# Patient Record
Sex: Male | Born: 1935 | Race: White | Hispanic: No | Marital: Married | Smoking: Former smoker
Health system: Southern US, Community
[De-identification: ages and names within clinical notes are randomized; demographics above are authoritative.]

## PROBLEM LIST (undated history)

## (undated) DIAGNOSIS — K7030 Alcoholic cirrhosis of liver without ascites: Secondary | ICD-10-CM

## (undated) DIAGNOSIS — K767 Hepatorenal syndrome: Secondary | ICD-10-CM

## (undated) DIAGNOSIS — R7989 Other specified abnormal findings of blood chemistry: Secondary | ICD-10-CM

## (undated) DIAGNOSIS — D696 Thrombocytopenia, unspecified: Secondary | ICD-10-CM

## (undated) DIAGNOSIS — I10 Essential (primary) hypertension: Secondary | ICD-10-CM

## (undated) DIAGNOSIS — E119 Type 2 diabetes mellitus without complications: Secondary | ICD-10-CM

## (undated) DIAGNOSIS — D509 Iron deficiency anemia, unspecified: Secondary | ICD-10-CM

## (undated) HISTORY — PX: FRACTURE SURGERY: SHX138

## (undated) HISTORY — DX: Iron deficiency anemia, unspecified: D50.9

## (undated) HISTORY — PX: TONSILLECTOMY: SUR1361

---

## 2013-04-13 ENCOUNTER — Encounter: Payer: Self-pay | Admitting: Internal Medicine

## 2013-04-15 ENCOUNTER — Encounter: Payer: Self-pay | Admitting: Internal Medicine

## 2013-04-27 LAB — BASIC METABOLIC PANEL
Anion Gap: 7 (ref 7–16)
Calcium, Total: 8.1 mg/dL — ABNORMAL LOW (ref 8.5–10.1)
Chloride: 102 mmol/L (ref 98–107)
Co2: 26 mmol/L (ref 21–32)
Creatinine: 2.82 mg/dL — ABNORMAL HIGH (ref 0.60–1.30)
EGFR (African American): 24 — ABNORMAL LOW
EGFR (Non-African Amer.): 21 — ABNORMAL LOW
Glucose: 142 mg/dL — ABNORMAL HIGH (ref 65–99)
Potassium: 4.1 mmol/L (ref 3.5–5.1)
Sodium: 135 mmol/L — ABNORMAL LOW (ref 136–145)

## 2013-04-27 LAB — CBC WITH DIFFERENTIAL/PLATELET
HCT: 24 % — ABNORMAL LOW (ref 40.0–52.0)
Lymphocyte %: 25.4 %
MCHC: 34.6 g/dL (ref 32.0–36.0)
Monocyte #: 0.3 x10 3/mm (ref 0.2–1.0)
Neutrophil #: 1.4 10*3/uL (ref 1.4–6.5)
Neutrophil %: 58.7 %
Platelet: 71 10*3/uL — ABNORMAL LOW (ref 150–440)
RBC: 2.45 10*6/uL — ABNORMAL LOW (ref 4.40–5.90)
RDW: 17.4 % — ABNORMAL HIGH (ref 11.5–14.5)

## 2013-05-06 ENCOUNTER — Observation Stay: Payer: Self-pay | Admitting: Internal Medicine

## 2013-05-06 LAB — CBC WITH DIFFERENTIAL/PLATELET
Basophil #: 0 10*3/uL (ref 0.0–0.1)
Lymphocyte %: 37.9 %
MCHC: 34.4 g/dL (ref 32.0–36.0)
Monocyte #: 0.3 x10 3/mm (ref 0.2–1.0)
Monocyte %: 14 %
Platelet: 76 10*3/uL — ABNORMAL LOW (ref 150–440)
RDW: 16.6 % — ABNORMAL HIGH (ref 11.5–14.5)
WBC: 2.4 10*3/uL — ABNORMAL LOW (ref 3.8–10.6)

## 2013-05-06 LAB — PROTIME-INR: Prothrombin Time: 16.2 secs — ABNORMAL HIGH (ref 11.5–14.7)

## 2013-05-07 LAB — COMPREHENSIVE METABOLIC PANEL
Albumin: 1.7 g/dL — ABNORMAL LOW (ref 3.4–5.0)
Alkaline Phosphatase: 65 U/L (ref 50–136)
Anion Gap: 5 — ABNORMAL LOW (ref 7–16)
Bilirubin,Total: 0.6 mg/dL (ref 0.2–1.0)
Calcium, Total: 8.2 mg/dL — ABNORMAL LOW (ref 8.5–10.1)
Co2: 27 mmol/L (ref 21–32)
Creatinine: 2.9 mg/dL — ABNORMAL HIGH (ref 0.60–1.30)
EGFR (African American): 23 — ABNORMAL LOW
EGFR (Non-African Amer.): 20 — ABNORMAL LOW
Glucose: 83 mg/dL (ref 65–99)
SGPT (ALT): 28 U/L (ref 12–78)
Sodium: 135 mmol/L — ABNORMAL LOW (ref 136–145)
Total Protein: 4.5 g/dL — ABNORMAL LOW (ref 6.4–8.2)

## 2013-05-12 LAB — CBC WITH DIFFERENTIAL/PLATELET
Basophil %: 0.6 %
HGB: 9.4 g/dL — ABNORMAL LOW (ref 13.0–18.0)
Lymphocyte %: 27.6 %
MCH: 33.1 pg (ref 26.0–34.0)
MCHC: 34.1 g/dL (ref 32.0–36.0)
Monocyte #: 0.4 x10 3/mm (ref 0.2–1.0)
Neutrophil #: 2.2 10*3/uL (ref 1.4–6.5)
RDW: 17.5 % — ABNORMAL HIGH (ref 11.5–14.5)

## 2013-05-14 LAB — OCCULT BLOOD X 1 CARD TO LAB, STOOL: Occult Blood, Feces: NEGATIVE

## 2013-05-16 ENCOUNTER — Encounter: Payer: Self-pay | Admitting: Internal Medicine

## 2013-06-08 LAB — COMPREHENSIVE METABOLIC PANEL
Albumin: 1.9 g/dL — ABNORMAL LOW (ref 3.4–5.0)
Alkaline Phosphatase: 76 U/L (ref 50–136)
Chloride: 106 mmol/L (ref 98–107)
Co2: 23 mmol/L (ref 21–32)
EGFR (African American): 23 — ABNORMAL LOW
EGFR (Non-African Amer.): 20 — ABNORMAL LOW
Glucose: 122 mg/dL — ABNORMAL HIGH (ref 65–99)
Potassium: 3.8 mmol/L (ref 3.5–5.1)
SGOT(AST): 22 U/L (ref 15–37)
SGPT (ALT): 15 U/L (ref 12–78)
Sodium: 138 mmol/L (ref 136–145)
Total Protein: 5.2 g/dL — ABNORMAL LOW (ref 6.4–8.2)

## 2013-06-08 LAB — CBC WITH DIFFERENTIAL/PLATELET
Eosinophil #: 0 10*3/uL (ref 0.0–0.7)
Eosinophil %: 1.1 %
HCT: 25 % — ABNORMAL LOW (ref 40.0–52.0)
HGB: 8.8 g/dL — ABNORMAL LOW (ref 13.0–18.0)
Lymphocyte #: 1 10*3/uL (ref 1.0–3.6)
Lymphocyte %: 22.6 %
Monocyte #: 0.3 x10 3/mm (ref 0.2–1.0)
Neutrophil #: 3.2 10*3/uL (ref 1.4–6.5)
Neutrophil %: 69 %
RBC: 2.5 10*6/uL — ABNORMAL LOW (ref 4.40–5.90)
WBC: 4.6 10*3/uL (ref 3.8–10.6)

## 2013-06-15 ENCOUNTER — Encounter: Payer: Self-pay | Admitting: Internal Medicine

## 2013-07-16 ENCOUNTER — Encounter: Payer: Self-pay | Admitting: Internal Medicine

## 2013-08-02 ENCOUNTER — Ambulatory Visit: Payer: Self-pay

## 2013-09-13 ENCOUNTER — Ambulatory Visit: Payer: Self-pay | Admitting: Gastroenterology

## 2013-09-15 ENCOUNTER — Ambulatory Visit: Payer: Self-pay | Admitting: Gastroenterology

## 2013-10-15 ENCOUNTER — Ambulatory Visit: Payer: Self-pay | Admitting: Internal Medicine

## 2013-10-15 LAB — CBC CANCER CENTER
Basophil #: 0 x10 3/mm (ref 0.0–0.1)
Basophil %: 0.5 %
Eosinophil #: 0 x10 3/mm (ref 0.0–0.7)
Lymphocyte #: 1.3 x10 3/mm (ref 1.0–3.6)
MCHC: 34.3 g/dL (ref 32.0–36.0)
Monocyte #: 0.5 x10 3/mm (ref 0.2–1.0)
Monocyte %: 7.4 %
Neutrophil #: 4.2 x10 3/mm (ref 1.4–6.5)
RBC: 2.69 10*6/uL — ABNORMAL LOW (ref 4.40–5.90)
RDW: 12.5 % (ref 11.5–14.5)

## 2013-10-16 ENCOUNTER — Ambulatory Visit: Payer: Self-pay | Admitting: Internal Medicine

## 2013-11-22 ENCOUNTER — Ambulatory Visit: Payer: Self-pay | Admitting: Internal Medicine

## 2013-11-22 LAB — CBC CANCER CENTER
Basophil #: 0 x10 3/mm (ref 0.0–0.1)
Basophil %: 0.7 %
Eosinophil #: 0 x10 3/mm (ref 0.0–0.7)
Eosinophil %: 0.9 %
HCT: 26.5 % — ABNORMAL LOW (ref 40.0–52.0)
Lymphocyte #: 1.2 x10 3/mm (ref 1.0–3.6)
Lymphocyte %: 28.3 %
MCH: 35.3 pg — ABNORMAL HIGH (ref 26.0–34.0)
MCHC: 33.3 g/dL (ref 32.0–36.0)
MCV: 106 fL — ABNORMAL HIGH (ref 80–100)
Neutrophil %: 62.8 %
Platelet: 97 x10 3/mm — ABNORMAL LOW (ref 150–440)
RDW: 14.6 % — ABNORMAL HIGH (ref 11.5–14.5)
WBC: 4.1 x10 3/mm (ref 3.8–10.6)

## 2013-11-23 LAB — KAPPA/LAMBDA FREE LIGHT CHAINS (ARMC)

## 2013-12-16 ENCOUNTER — Ambulatory Visit: Payer: Self-pay | Admitting: Internal Medicine

## 2013-12-21 LAB — BASIC METABOLIC PANEL
Anion Gap: 13 (ref 7–16)
BUN: 62 mg/dL — ABNORMAL HIGH (ref 7–18)
CHLORIDE: 100 mmol/L (ref 98–107)
CO2: 26 mmol/L (ref 21–32)
Calcium, Total: 9.6 mg/dL (ref 8.5–10.1)
Creatinine: 4.59 mg/dL — ABNORMAL HIGH (ref 0.60–1.30)
EGFR (African American): 13 — ABNORMAL LOW
EGFR (Non-African Amer.): 12 — ABNORMAL LOW
Glucose: 131 mg/dL — ABNORMAL HIGH (ref 65–99)
OSMOLALITY: 297 (ref 275–301)
Potassium: 3.9 mmol/L (ref 3.5–5.1)
Sodium: 139 mmol/L (ref 136–145)

## 2013-12-21 LAB — CBC CANCER CENTER
BASOS ABS: 0 x10 3/mm (ref 0.0–0.1)
Basophil %: 0.6 %
EOS ABS: 0.1 x10 3/mm (ref 0.0–0.7)
EOS PCT: 1.3 %
HCT: 27.9 % — ABNORMAL LOW (ref 40.0–52.0)
HGB: 9.3 g/dL — ABNORMAL LOW (ref 13.0–18.0)
LYMPHS PCT: 27.3 %
Lymphocyte #: 1.2 x10 3/mm (ref 1.0–3.6)
MCH: 35.3 pg — AB (ref 26.0–34.0)
MCHC: 33.3 g/dL (ref 32.0–36.0)
MCV: 106 fL — ABNORMAL HIGH (ref 80–100)
Monocyte #: 0.3 x10 3/mm (ref 0.2–1.0)
Monocyte %: 7.5 %
NEUTROS ABS: 2.7 x10 3/mm (ref 1.4–6.5)
Neutrophil %: 63.3 %
Platelet: 112 x10 3/mm — ABNORMAL LOW (ref 150–440)
RBC: 2.63 10*6/uL — AB (ref 4.40–5.90)
RDW: 13.5 % (ref 11.5–14.5)
WBC: 4.3 x10 3/mm (ref 3.8–10.6)

## 2013-12-23 LAB — AFP TUMOR MARKER: AFP-Tumor Marker: 0.8 ng/mL (ref 0.0–8.3)

## 2014-01-16 ENCOUNTER — Ambulatory Visit: Payer: Self-pay | Admitting: Internal Medicine

## 2014-01-17 LAB — CBC CANCER CENTER
BASOS ABS: 0 x10 3/mm (ref 0.0–0.1)
BASOS PCT: 0.9 %
EOS ABS: 0 x10 3/mm (ref 0.0–0.7)
Eosinophil %: 1 %
HCT: 26.8 % — ABNORMAL LOW (ref 40.0–52.0)
HGB: 9 g/dL — ABNORMAL LOW (ref 13.0–18.0)
LYMPHS ABS: 1.1 x10 3/mm (ref 1.0–3.6)
Lymphocyte %: 27.8 %
MCH: 34.9 pg — ABNORMAL HIGH (ref 26.0–34.0)
MCHC: 33.5 g/dL (ref 32.0–36.0)
MCV: 104 fL — ABNORMAL HIGH (ref 80–100)
MONOS PCT: 9.9 %
Monocyte #: 0.4 x10 3/mm (ref 0.2–1.0)
NEUTROS PCT: 60.4 %
Neutrophil #: 2.4 x10 3/mm (ref 1.4–6.5)
Platelet: 101 x10 3/mm — ABNORMAL LOW (ref 150–440)
RBC: 2.57 10*6/uL — AB (ref 4.40–5.90)
RDW: 12.9 % (ref 11.5–14.5)
WBC: 3.9 x10 3/mm (ref 3.8–10.6)

## 2014-02-13 ENCOUNTER — Ambulatory Visit: Payer: Self-pay | Admitting: Internal Medicine

## 2014-05-02 ENCOUNTER — Ambulatory Visit: Payer: Self-pay | Admitting: Internal Medicine

## 2014-05-16 ENCOUNTER — Ambulatory Visit: Payer: Self-pay | Admitting: Internal Medicine

## 2015-01-25 DIAGNOSIS — F1010 Alcohol abuse, uncomplicated: Secondary | ICD-10-CM | POA: Insufficient documentation

## 2015-01-25 DIAGNOSIS — N184 Chronic kidney disease, stage 4 (severe): Secondary | ICD-10-CM | POA: Insufficient documentation

## 2015-04-25 ENCOUNTER — Ambulatory Visit
Admission: RE | Admit: 2015-04-25 | Discharge: 2015-04-25 | Disposition: A | Discharge status: Home / Self Care | Payer: Medicare Other | Source: Ambulatory Visit | Attending: Nephrology | Admitting: Nephrology

## 2015-04-25 DIAGNOSIS — D631 Anemia in chronic kidney disease: Secondary | ICD-10-CM | POA: Diagnosis present

## 2015-04-25 DIAGNOSIS — N189 Chronic kidney disease, unspecified: Secondary | ICD-10-CM | POA: Insufficient documentation

## 2015-04-25 HISTORY — DX: Thrombocytopenia, unspecified: D69.6

## 2015-04-25 HISTORY — DX: Hepatorenal syndrome: K76.7

## 2015-04-25 HISTORY — DX: Other specified abnormal findings of blood chemistry: R79.89

## 2015-04-25 HISTORY — DX: Alcoholic cirrhosis of liver without ascites: K70.30

## 2015-04-25 LAB — ABO/RH: ABO/RH(D): O NEG

## 2015-04-25 LAB — HEMOGLOBIN: Hemoglobin: 7.2 g/dL — ABNORMAL LOW (ref 13.0–18.0)

## 2015-04-25 LAB — PREPARE RBC (CROSSMATCH)

## 2015-04-25 NOTE — Progress Notes (Signed)
Pt blood transfusion completed without reaction. Pt declines po fluids or snacks. Pt waiting transportation home.

## 2015-04-26 LAB — TYPE AND SCREEN
ABO/RH(D): O NEG
Antibody Screen: NEGATIVE
UNIT DIVISION: 0

## 2015-04-28 ENCOUNTER — Other Ambulatory Visit: Payer: Self-pay | Admitting: *Deleted

## 2015-05-01 ENCOUNTER — Inpatient Hospital Stay (HOSPITAL_BASED_OUTPATIENT_CLINIC_OR_DEPARTMENT_OTHER): Payer: Medicare Other | Admitting: Internal Medicine

## 2015-05-01 ENCOUNTER — Other Ambulatory Visit: Payer: Self-pay | Admitting: Internal Medicine

## 2015-05-01 ENCOUNTER — Inpatient Hospital Stay: Payer: Medicare Other | Attending: Internal Medicine

## 2015-05-01 ENCOUNTER — Inpatient Hospital Stay: Payer: Medicare Other

## 2015-05-01 VITALS — BP 131/81 | HR 101 | Temp 98.0°F | Resp 18 | Wt 175.0 lb

## 2015-05-01 DIAGNOSIS — F1010 Alcohol abuse, uncomplicated: Secondary | ICD-10-CM | POA: Insufficient documentation

## 2015-05-01 DIAGNOSIS — N184 Chronic kidney disease, stage 4 (severe): Secondary | ICD-10-CM

## 2015-05-01 DIAGNOSIS — K7030 Alcoholic cirrhosis of liver without ascites: Secondary | ICD-10-CM | POA: Insufficient documentation

## 2015-05-01 DIAGNOSIS — K767 Hepatorenal syndrome: Secondary | ICD-10-CM | POA: Diagnosis not present

## 2015-05-01 DIAGNOSIS — D619 Aplastic anemia, unspecified: Secondary | ICD-10-CM | POA: Insufficient documentation

## 2015-05-01 DIAGNOSIS — Z79899 Other long term (current) drug therapy: Secondary | ICD-10-CM

## 2015-05-01 DIAGNOSIS — D631 Anemia in chronic kidney disease: Secondary | ICD-10-CM

## 2015-05-01 DIAGNOSIS — I129 Hypertensive chronic kidney disease with stage 1 through stage 4 chronic kidney disease, or unspecified chronic kidney disease: Secondary | ICD-10-CM | POA: Diagnosis not present

## 2015-05-01 DIAGNOSIS — R531 Weakness: Secondary | ICD-10-CM

## 2015-05-01 DIAGNOSIS — N189 Chronic kidney disease, unspecified: Secondary | ICD-10-CM

## 2015-05-01 DIAGNOSIS — E119 Type 2 diabetes mellitus without complications: Secondary | ICD-10-CM | POA: Insufficient documentation

## 2015-05-01 DIAGNOSIS — D649 Anemia, unspecified: Secondary | ICD-10-CM

## 2015-05-01 LAB — HEMOGLOBIN: HEMOGLOBIN: 10.2 g/dL — AB (ref 13.0–18.0)

## 2015-05-09 ENCOUNTER — Encounter: Payer: Self-pay | Admitting: Internal Medicine

## 2015-05-09 NOTE — Progress Notes (Signed)
[facility] Progress note   Referred by dr Wynelle Link   This 79 y.o. male patient presents to the clinic for evaluation of anemia   Chief Complaint/Problem List: has Chronic kidney disease (CKD), stage IV (severe); Diabetes mellitus, type 2; AA (alcohol abuse); Bone marrow failure; and ALC (alcoholic liver cirrhosis) on his problem list.    HPI:  SEE ALSO 05/02/14 NOTE, REFERRED [facility] FOR PROGRESSIVE ANEMIA, TO CONSIDER PROCRIT, PRIOR KNOWN TO ME FROM 05/02/14, THEN RELEASED  hgb recently noted lower than prior, with cre stable, chronic low plts no hx bleeding   On 04/25/15 hgb 7.2 and one unit prbc given  No acute complaints no cp no sob no dizziness no palpitation    Review of Systems:  He has some general weakness leg weakness no sob at rest no abdo pain no cp no edema                       Allergies No Known Allergies  Significant History/PMH: Past Medical History  Diagnosis Date  . Alcoholic cirrhosis   . Thrombocytopenia   . High serum ferritin   . Hepatorenal syndrome     Secondary   History reviewed. No pertinent past surgical history.          Smoking History prior smoker quit 1967  PFSH: Family History:  Family History  Problem Relation Age of Onset  . Cancer - Colon Mother 71  . Breast cancer Sister 19  . Prostate cancer Other     Comments:   Social History:  History  Alcohol Use  . Yes    Comment: 5 drinks per day/ discussed alcohol health risks    Additional Past Medical and Surgical History:  barretts   Also hx neghiv and rpr in 04/2013   Home Medications: Prior to Admission medications   Medication Sig Start Date End Date Taking? Authorizing Provider  Multiple Vitamin (MULTIVITAMIN) tablet Take 1 tablet by mouth daily.   Yes Historical Provider, MD  furosemide (LASIX) 20 MG tablet Take 20 mg by mouth.    Historical Provider, MD  loperamide (IMODIUM) 2 MG capsule Take by mouth as needed for diarrhea or loose stools.    Historical  Provider, MD  METOPROLOL SUCCINATE ER PO Take 25 mg by mouth.    Historical Provider, MD  mirtazapine (REMERON) 7.5 MG tablet Take 7.5 mg by mouth at bedtime.    Historical Provider, MD  omeprazole (PRILOSEC) 40 MG capsule Take 40 mg by mouth daily.    Historical Provider, MD    Vital Signs:  Blood pressure 131/81, pulse 101, temperature 98 F (36.7 C), temperature source Oral, resp. rate 18, weight 175 lb (79.379 kg), SpO2 97 %.  Physical Exam:  General:  no acute distress  Mental Status: alert and oriented   Head, Ears, Nose,Throat: No thrush  Respiratory: no rales, rhonchi, or wheezing, no dullness  Cardiovascular: regular rate and rhythm    Musculoskeletal: no lower extremity edema no calf tenderness    Neurological: No gross focal weakness cranial nerves intact  Lymphatics: Not palpable, neck supraclavicular, submandibular, axilla       Laboratory Results: Appointment on 05/01/2015  Component Date Value Ref Range Status  . Hemoglobin 05/01/2015 10.2* 13.0 - 18.0 g/dL Final          Radiology Results: No results found.         Assessment and Plan: Impression:  Anemia, recently progressive, no hx of bleeding, hgb up  to 10.2 aftre transfusion..most likely cause progressive cirrhosis and direct effect of alcohol, currently admits to 2 drinks per day, likely more   Plan: w/u anemia with iron b12, electropheresis, also abdo u/s and afp level. Procrit prn

## 2015-05-11 ENCOUNTER — Ambulatory Visit: Payer: Self-pay

## 2015-05-11 ENCOUNTER — Ambulatory Visit: Payer: Self-pay | Admitting: Internal Medicine

## 2015-05-11 ENCOUNTER — Other Ambulatory Visit: Payer: Self-pay

## 2015-05-18 ENCOUNTER — Other Ambulatory Visit: Payer: Self-pay | Admitting: *Deleted

## 2015-05-18 ENCOUNTER — Telehealth: Payer: Self-pay | Admitting: *Deleted

## 2015-05-18 NOTE — Telephone Encounter (Signed)
Prescription for CBC with diff/plts, Iron/TIBC, Ferritin, and B-12 level to be drawn on Monday 05/22/15 and faxed to Rhett BannisterLeslie Herring, AGNP-C faxed to Attn: Azalia BilisLori Wade [facility] [place] at [phone].Marland Kitchen..Marland Kitchen

## 2015-05-22 ENCOUNTER — Encounter: Payer: Self-pay | Admitting: Family Medicine

## 2015-05-24 ENCOUNTER — Encounter: Payer: Self-pay | Admitting: Family Medicine

## 2015-05-24 ENCOUNTER — Inpatient Hospital Stay: Payer: Medicare Other | Attending: Internal Medicine

## 2015-05-24 ENCOUNTER — Inpatient Hospital Stay: Payer: Medicare Other | Admitting: Family Medicine

## 2015-05-24 ENCOUNTER — Other Ambulatory Visit: Payer: Self-pay | Admitting: Family Medicine

## 2015-05-24 ENCOUNTER — Inpatient Hospital Stay: Payer: Medicare Other

## 2015-05-24 DIAGNOSIS — D509 Iron deficiency anemia, unspecified: Secondary | ICD-10-CM

## 2015-05-24 HISTORY — DX: Iron deficiency anemia, unspecified: D50.9

## 2015-06-22 ENCOUNTER — Inpatient Hospital Stay: Payer: Medicare Other | Attending: Family Medicine

## 2015-06-22 ENCOUNTER — Ambulatory Visit: Payer: Self-pay | Admitting: Family Medicine

## 2015-06-22 ENCOUNTER — Other Ambulatory Visit: Payer: Self-pay

## 2015-06-22 ENCOUNTER — Inpatient Hospital Stay (HOSPITAL_BASED_OUTPATIENT_CLINIC_OR_DEPARTMENT_OTHER): Payer: Medicare Other | Admitting: Family Medicine

## 2015-06-22 VITALS — BP 142/74 | HR 67 | Temp 97.7°F | Resp 16

## 2015-06-22 DIAGNOSIS — R5381 Other malaise: Secondary | ICD-10-CM

## 2015-06-22 DIAGNOSIS — N184 Chronic kidney disease, stage 4 (severe): Secondary | ICD-10-CM

## 2015-06-22 DIAGNOSIS — Z800 Family history of malignant neoplasm of digestive organs: Secondary | ICD-10-CM | POA: Diagnosis not present

## 2015-06-22 DIAGNOSIS — D631 Anemia in chronic kidney disease: Secondary | ICD-10-CM

## 2015-06-22 DIAGNOSIS — R531 Weakness: Secondary | ICD-10-CM | POA: Insufficient documentation

## 2015-06-22 DIAGNOSIS — Z79899 Other long term (current) drug therapy: Secondary | ICD-10-CM

## 2015-06-22 DIAGNOSIS — R5383 Other fatigue: Secondary | ICD-10-CM | POA: Diagnosis not present

## 2015-06-22 DIAGNOSIS — E119 Type 2 diabetes mellitus without complications: Secondary | ICD-10-CM | POA: Diagnosis not present

## 2015-06-22 DIAGNOSIS — R600 Localized edema: Secondary | ICD-10-CM

## 2015-06-22 DIAGNOSIS — D509 Iron deficiency anemia, unspecified: Secondary | ICD-10-CM | POA: Insufficient documentation

## 2015-06-22 DIAGNOSIS — Z8042 Family history of malignant neoplasm of prostate: Secondary | ICD-10-CM | POA: Insufficient documentation

## 2015-06-22 DIAGNOSIS — I129 Hypertensive chronic kidney disease with stage 1 through stage 4 chronic kidney disease, or unspecified chronic kidney disease: Secondary | ICD-10-CM

## 2015-06-22 DIAGNOSIS — K7030 Alcoholic cirrhosis of liver without ascites: Secondary | ICD-10-CM | POA: Insufficient documentation

## 2015-06-22 DIAGNOSIS — D696 Thrombocytopenia, unspecified: Secondary | ICD-10-CM

## 2015-06-22 DIAGNOSIS — Z803 Family history of malignant neoplasm of breast: Secondary | ICD-10-CM | POA: Diagnosis not present

## 2015-06-22 DIAGNOSIS — Z87891 Personal history of nicotine dependence: Secondary | ICD-10-CM | POA: Diagnosis not present

## 2015-06-22 LAB — CBC WITH DIFFERENTIAL/PLATELET
Basophils Absolute: 0 10*3/uL (ref 0–0.1)
Basophils Relative: 1 %
Eosinophils Absolute: 0 10*3/uL (ref 0–0.7)
Eosinophils Relative: 1 %
HEMATOCRIT: 28.2 % — AB (ref 40.0–52.0)
Hemoglobin: 9.3 g/dL — ABNORMAL LOW (ref 13.0–18.0)
LYMPHS ABS: 1 10*3/uL (ref 1.0–3.6)
LYMPHS PCT: 20 %
MCH: 34.7 pg — ABNORMAL HIGH (ref 26.0–34.0)
MCHC: 32.9 g/dL (ref 32.0–36.0)
MCV: 105.6 fL — ABNORMAL HIGH (ref 80.0–100.0)
MONO ABS: 0.4 10*3/uL (ref 0.2–1.0)
Monocytes Relative: 7 %
Neutro Abs: 3.6 10*3/uL (ref 1.4–6.5)
Neutrophils Relative %: 71 %
Platelets: 109 10*3/uL — ABNORMAL LOW (ref 150–440)
RBC: 2.67 MIL/uL — ABNORMAL LOW (ref 4.40–5.90)
RDW: 15.2 % — ABNORMAL HIGH (ref 11.5–14.5)
WBC: 5 10*3/uL (ref 3.8–10.6)

## 2015-06-22 LAB — IRON AND TIBC
IRON: 101 ug/dL (ref 45–182)
SATURATION RATIOS: 69 % — AB (ref 17.9–39.5)
TIBC: 146 ug/dL — ABNORMAL LOW (ref 250–450)
UIBC: 45 ug/dL

## 2015-06-22 LAB — FERRITIN: Ferritin: 1287 ng/mL — ABNORMAL HIGH (ref 24–336)

## 2015-06-22 NOTE — Progress Notes (Signed)
[facility]  [phone]  [phone]     Mike Booker DOB: 02/24/1936  MR#: [id number]  YQM#:[id number]  Patient Care Team: Clydie Braun, MD as PCP - General (Infectious Diseases) Lamont Dowdy, MD as Consulting Physician (Internal Medicine) Marin Roberts, MD as Consulting Physician (Hematology and Oncology)  CHIEF COMPLAINT:  Chief Complaint  Patient presents with  . Follow-up   Patient has a history of anemia. Also with extensive past medical history of chronic kidney disease stage IV, diabetes type 2, alcohol abuse, bone marrow failure, alcoholic liver cirrhosis.  INTERVAL HISTORY:  Patient is here today for further evaluation and treatment consideration regarding progressive anemia. Her previous note with Dr. Lorre Nick from 05/01/2015 patient is being evaluated for Procrit. He was previously released from Dr. Paula Compton care in May 2015 due to stability of hemoglobin. He he reports some weakness and fatigue, bilateral lower extremity swelling that improves with rest and elevation, as well as easy bruising. He reports having 2-3 glasses of scotch a day.  REVIEW OF SYSTEMS:   Review of Systems  Constitutional: Positive for malaise/fatigue. Negative for fever, chills, weight loss and diaphoresis.  HENT: Negative for congestion, ear discharge, ear pain, hearing loss, nosebleeds, sore throat and tinnitus.   Eyes: Negative for blurred vision, double vision, photophobia, pain, discharge and redness.  Respiratory: Negative for cough, hemoptysis, sputum production, shortness of breath, wheezing and stridor.   Cardiovascular: Positive for leg swelling. Negative for chest pain, palpitations, orthopnea, claudication and PND.  Gastrointestinal: Negative for heartburn, nausea, vomiting, abdominal pain, diarrhea, constipation, blood in stool and melena.  Genitourinary: Negative.   Musculoskeletal: Negative.   Skin: Negative.   Neurological: Positive for  weakness. Negative for dizziness, tingling, focal weakness, seizures and headaches.  Endo/Heme/Allergies: Bruises/bleeds easily.  Psychiatric/Behavioral: Negative for depression. The patient is not nervous/anxious and does not have insomnia.     As per HPI. Otherwise, a complete review of systems is negatve.  ONCOLOGY HISTORY:  No history exists.    PAST MEDICAL HISTORY: Past Medical History  Diagnosis Date  . Alcoholic cirrhosis   . Thrombocytopenia   . High serum ferritin   . Hepatorenal syndrome     Secondary  . IDA (iron deficiency anemia) 05/24/2015    PAST SURGICAL HISTORY: No past surgical history on file.  FAMILY HISTORY Family History  Problem Relation Age of Onset  . Cancer - Colon Mother 44  . Breast cancer Sister 31  . Prostate cancer Other     GYNECOLOGIC HISTORY:  No LMP for male patient.     ADVANCED DIRECTIVES:    HEALTH MAINTENANCE: History  Substance Use Topics  . Smoking status: Former Smoker    Types: Cigarettes    Quit date: 12/16/1965  . Smokeless tobacco: Not on file  . Alcohol Use: Yes     Comment: 5 drinks per day/ discussed alcohol health risks     Colonoscopy:  PAP:  Bone density:  Lipid panel:  No Known Allergies  Current Outpatient Prescriptions  Medication Sig Dispense Refill  . furosemide (LASIX) 20 MG tablet Take 20 mg by mouth.    . loperamide (IMODIUM) 2 MG capsule Take by mouth as needed for diarrhea or loose stools.    . metoprolol tartrate (LOPRESSOR) 25 MG tablet Take 25 mg by mouth 2 (two) times daily.    . Multiple Vitamin (MULTIVITAMIN) tablet Take 1 tablet by mouth daily.    Marland Kitchen omeprazole (PRILOSEC) 40 MG capsule Take 40 mg by mouth  daily.    Marland Kitchen. METOPROLOL SUCCINATE ER PO Take 25 mg by mouth.    . mirtazapine (REMERON) 7.5 MG tablet Take 7.5 mg by mouth at bedtime.     No current facility-administered medications for this visit.    OBJECTIVE: BP 142/74 mmHg  Pulse 67  Temp(Src) 97.7 F (36.5 C) (Tympanic)   Resp 16  Wt    There is no height or weight on file to calculate BMI.    ECOG FS:2 - Symptomatic, <50% confined to bed  General: Well-developed, well-nourished, no acute distress. Lungs: Clear to auscultation bilaterally. Heart: Regular rate and rhythm. No rubs, murmurs, or gallops. Abdomen: Soft, nontender, nondistended. No organomegaly noted, normoactive bowel sounds. Musculoskeletal: No edema, cyanosis, or clubbing. Neuro: Alert, answering all questions appropriately. Cranial nerves grossly intact. Skin: Ecchymosis noted over arms Psych: Normal affect.  LAB RESULTS:  Appointment on 06/22/2015  Component Date Value Ref Range Status  . WBC 06/22/2015 5.0  3.8 - 10.6 K/uL Final  . RBC 06/22/2015 2.67* 4.40 - 5.90 MIL/uL Final  . Hemoglobin 06/22/2015 9.3* 13.0 - 18.0 g/dL Final  . HCT 09/81/191407/06/2015 28.2* 40.0 - 52.0 % Final  . MCV 06/22/2015 105.6* 80.0 - 100.0 fL Final  . MCH 06/22/2015 34.7* 26.0 - 34.0 pg Final  . MCHC 06/22/2015 32.9  32.0 - 36.0 g/dL Final  . RDW 78/29/562107/06/2015 15.2* 11.5 - 14.5 % Final  . Platelets 06/22/2015 109* 150 - 440 K/uL Final  . Neutrophils Relative % 06/22/2015 71   Final  . Neutro Abs 06/22/2015 3.6  1.4 - 6.5 K/uL Final  . Lymphocytes Relative 06/22/2015 20   Final  . Lymphs Abs 06/22/2015 1.0  1.0 - 3.6 K/uL Final  . Monocytes Relative 06/22/2015 7   Final  . Monocytes Absolute 06/22/2015 0.4  0.2 - 1.0 K/uL Final  . Eosinophils Relative 06/22/2015 1   Final  . Eosinophils Absolute 06/22/2015 0.0  0 - 0.7 K/uL Final  . Basophils Relative 06/22/2015 1   Final  . Basophils Absolute 06/22/2015 0.0  0 - 0.1 K/uL Final  . Iron 06/22/2015 101  45 - 182 ug/dL Final  . TIBC 30/86/578407/06/2015 146* 250 - 450 ug/dL Final  . Saturation Ratios 06/22/2015 69* 17.9 - 39.5 % Final  . UIBC 06/22/2015 45   Final  . Ferritin 06/22/2015 1287* 24 - 336 ng/mL Final    STUDIES: No results found.  ASSESSMENT:   anemia  PLAN:   1. Anemia. Patient with multifactorial  progressive anemia. Most likely causes are progressive cirrhosis, direct effect of alcohol, and chronic kidney disease. He has no active signs or symptoms of bleeding. Hemoglobin fairly stable at 9.3. Per patient's preference he does not wish to start Procrit yet. He desires to continue lab work every 2 weeks with a CBC. We will schedule further evaluation 6 weeks  Patient expressed understanding and was in agreement with this plan. He also understands that He can call clinic at any time with any questions, concerns, or complaints.   Dr. Doylene Canninghoksi was available for consultation and review of plan of care for this patient.    Loann QuillLeslie F Herring, NP   06/22/2015 3:52 PM

## 2015-07-07 ENCOUNTER — Telehealth: Payer: Self-pay | Admitting: *Deleted

## 2015-07-07 NOTE — Telephone Encounter (Signed)
I called pt and informed him that his Hgb was 8.6 as discussed with [name], AGNP-C. He stated that he would take notice when it got below 8. He thanked me for calling and informed me that his next labs were scheduled to be drawn on 07/20/15 and that he would see "Ms. Herring" after that.Marland KitchenMarland Kitchen

## 2015-07-20 ENCOUNTER — Encounter: Payer: Self-pay | Admitting: Hematology and Oncology

## 2015-07-21 ENCOUNTER — Telehealth: Payer: Self-pay | Admitting: *Deleted

## 2015-07-21 NOTE — Telephone Encounter (Signed)
Patient's spouse called regarding lab results from last visit.  Can someone call if results are available.

## 2015-07-24 ENCOUNTER — Telehealth: Payer: Self-pay | Admitting: *Deleted

## 2015-07-24 NOTE — Telephone Encounter (Signed)
Will inform Verlon Au of this. Pt does not want to do anything unless it is absolutely medically necessary.Marland KitchenMarland KitchenVerlon Au is aware of this.Marland KitchenMarland Kitchen

## 2015-07-24 NOTE — Telephone Encounter (Signed)
Asking for results, HGB is 7.7

## 2015-07-24 NOTE — Telephone Encounter (Signed)
  I don't think I know this patient.  Is this a prior [facility] patient? If so, when is his follow-up?  M

## 2015-07-25 NOTE — Telephone Encounter (Signed)
Labs are stable. Can transfuse if agreeable

## 2015-07-25 NOTE — Telephone Encounter (Signed)
Pt reports he is fine except a little weak, does not want transfusion Reports has labs again on the 18th

## 2015-07-25 NOTE — Telephone Encounter (Signed)
Yes this is a former [facility] patient and the message is going to be asked of Verlon Au who saw him once. He does have an appt with you on 9/22

## 2015-08-09 ENCOUNTER — Encounter: Payer: Self-pay | Admitting: Emergency Medicine

## 2015-08-09 ENCOUNTER — Other Ambulatory Visit: Payer: Self-pay

## 2015-08-09 ENCOUNTER — Emergency Department
Admission: EM | Admit: 2015-08-09 | Discharge: 2015-08-09 | Disposition: A | Discharge status: Home / Self Care | Payer: Medicare Other | Attending: Emergency Medicine | Admitting: Emergency Medicine

## 2015-08-09 ENCOUNTER — Emergency Department: Payer: Medicare Other

## 2015-08-09 DIAGNOSIS — Y9389 Activity, other specified: Secondary | ICD-10-CM | POA: Diagnosis not present

## 2015-08-09 DIAGNOSIS — S0993XA Unspecified injury of face, initial encounter: Secondary | ICD-10-CM | POA: Insufficient documentation

## 2015-08-09 DIAGNOSIS — Z23 Encounter for immunization: Secondary | ICD-10-CM | POA: Insufficient documentation

## 2015-08-09 DIAGNOSIS — S6991XA Unspecified injury of right wrist, hand and finger(s), initial encounter: Secondary | ICD-10-CM | POA: Diagnosis not present

## 2015-08-09 DIAGNOSIS — T8602 Bone marrow transplant failure: Secondary | ICD-10-CM | POA: Diagnosis not present

## 2015-08-09 DIAGNOSIS — S20319A Abrasion of unspecified front wall of thorax, initial encounter: Secondary | ICD-10-CM | POA: Insufficient documentation

## 2015-08-09 DIAGNOSIS — W010XXA Fall on same level from slipping, tripping and stumbling without subsequent striking against object, initial encounter: Secondary | ICD-10-CM | POA: Diagnosis not present

## 2015-08-09 DIAGNOSIS — Z79811 Long term (current) use of aromatase inhibitors: Secondary | ICD-10-CM | POA: Insufficient documentation

## 2015-08-09 DIAGNOSIS — D619 Aplastic anemia, unspecified: Secondary | ICD-10-CM

## 2015-08-09 DIAGNOSIS — Y92129 Unspecified place in nursing home as the place of occurrence of the external cause: Secondary | ICD-10-CM | POA: Diagnosis not present

## 2015-08-09 DIAGNOSIS — S0003XA Contusion of scalp, initial encounter: Secondary | ICD-10-CM | POA: Insufficient documentation

## 2015-08-09 DIAGNOSIS — Z79899 Other long term (current) drug therapy: Secondary | ICD-10-CM | POA: Diagnosis not present

## 2015-08-09 DIAGNOSIS — S0081XA Abrasion of other part of head, initial encounter: Secondary | ICD-10-CM | POA: Insufficient documentation

## 2015-08-09 DIAGNOSIS — Y998 Other external cause status: Secondary | ICD-10-CM | POA: Diagnosis not present

## 2015-08-09 DIAGNOSIS — Z87891 Personal history of nicotine dependence: Secondary | ICD-10-CM | POA: Diagnosis not present

## 2015-08-09 DIAGNOSIS — Y830 Surgical operation with transplant of whole organ as the cause of abnormal reaction of the patient, or of later complication, without mention of misadventure at the time of the procedure: Secondary | ICD-10-CM | POA: Insufficient documentation

## 2015-08-09 DIAGNOSIS — E119 Type 2 diabetes mellitus without complications: Secondary | ICD-10-CM | POA: Diagnosis not present

## 2015-08-09 DIAGNOSIS — S50319A Abrasion of unspecified elbow, initial encounter: Secondary | ICD-10-CM | POA: Insufficient documentation

## 2015-08-09 DIAGNOSIS — N390 Urinary tract infection, site not specified: Secondary | ICD-10-CM

## 2015-08-09 DIAGNOSIS — T07XXXA Unspecified multiple injuries, initial encounter: Secondary | ICD-10-CM

## 2015-08-09 DIAGNOSIS — D509 Iron deficiency anemia, unspecified: Secondary | ICD-10-CM

## 2015-08-09 DIAGNOSIS — S0990XA Unspecified injury of head, initial encounter: Secondary | ICD-10-CM | POA: Diagnosis present

## 2015-08-09 LAB — BASIC METABOLIC PANEL
Anion gap: 13 (ref 5–15)
BUN: 41 mg/dL — AB (ref 6–20)
CALCIUM: 8.7 mg/dL — AB (ref 8.9–10.3)
CO2: 24 mmol/L (ref 22–32)
Chloride: 89 mmol/L — ABNORMAL LOW (ref 101–111)
Creatinine, Ser: 3.4 mg/dL — ABNORMAL HIGH (ref 0.61–1.24)
GFR calc non Af Amer: 16 mL/min — ABNORMAL LOW (ref >60)
GFR, EST AFRICAN AMERICAN: 19 mL/min — AB (ref >60)
Glucose, Bld: 123 mg/dL — ABNORMAL HIGH (ref 65–99)
Potassium: 5.6 mmol/L — ABNORMAL HIGH (ref 3.5–5.1)
SODIUM: 126 mmol/L — AB (ref 135–145)

## 2015-08-09 LAB — CBC WITH DIFFERENTIAL/PLATELET
BASOS PCT: 1 %
Basophils Absolute: 0 10*3/uL (ref 0–0.1)
EOS ABS: 0 10*3/uL (ref 0–0.7)
Eosinophils Relative: 0 %
HCT: 22.4 % — ABNORMAL LOW (ref 40.0–52.0)
Hemoglobin: 7.7 g/dL — ABNORMAL LOW (ref 13.0–18.0)
LYMPHS ABS: 0.4 10*3/uL — AB (ref 1.0–3.6)
Lymphocytes Relative: 6 %
MCH: 36.3 pg — AB (ref 26.0–34.0)
MCHC: 34.3 g/dL (ref 32.0–36.0)
MCV: 105.9 fL — ABNORMAL HIGH (ref 80.0–100.0)
MONOS PCT: 7 %
Monocytes Absolute: 0.4 10*3/uL (ref 0.2–1.0)
Neutro Abs: 5.4 10*3/uL (ref 1.4–6.5)
Neutrophils Relative %: 86 %
Platelets: 123 10*3/uL — ABNORMAL LOW (ref 150–440)
RBC: 2.12 MIL/uL — ABNORMAL LOW (ref 4.40–5.90)
RDW: 14.1 % (ref 11.5–14.5)
WBC: 6.2 10*3/uL (ref 3.8–10.6)

## 2015-08-09 LAB — HEPATIC FUNCTION PANEL
ALBUMIN: 2.5 g/dL — AB (ref 3.5–5.0)
ALK PHOS: 363 U/L — AB (ref 38–126)
ALT: 51 U/L (ref 17–63)
AST: 100 U/L — ABNORMAL HIGH (ref 15–41)
BILIRUBIN INDIRECT: 2.6 mg/dL — AB (ref 0.3–0.9)
Bilirubin, Direct: 2.4 mg/dL — ABNORMAL HIGH (ref 0.1–0.5)
TOTAL PROTEIN: 6.5 g/dL (ref 6.5–8.1)
Total Bilirubin: 5 mg/dL — ABNORMAL HIGH (ref 0.3–1.2)

## 2015-08-09 LAB — URINALYSIS COMPLETE WITH MICROSCOPIC (ARMC ONLY)
Glucose, UA: NEGATIVE mg/dL
Hgb urine dipstick: NEGATIVE
NITRITE: NEGATIVE
PROTEIN: NEGATIVE mg/dL
SPECIFIC GRAVITY, URINE: 1.014 (ref 1.005–1.030)
pH: 5 (ref 5.0–8.0)

## 2015-08-09 MED ORDER — CEPHALEXIN 500 MG PO CAPS: 500.0000 mg | ORAL_CAPSULE | Freq: Three times a day (TID) | ORAL | Status: AC

## 2015-08-09 MED ORDER — BACITRACIN ZINC 500 UNIT/GM EX OINT
TOPICAL_OINTMENT | CUTANEOUS | Status: AC
  Filled 2015-08-09: qty 1.8

## 2015-08-09 MED ORDER — TETANUS-DIPHTH-ACELL PERTUSSIS 5-2.5-18.5 LF-MCG/0.5 IM SUSP
0.5000 mL | Freq: Once | INTRAMUSCULAR | Status: AC
  Administered 2015-08-09: 0.5 mL via INTRAMUSCULAR
  Filled 2015-08-09: qty 0.5

## 2015-08-09 NOTE — ED Notes (Signed)
Pt frm [facility] after falling. Pt is on HTN meds and has not taken it today. EMS brought him due to bp of 94/64 on scene. Pt states that he has fallen 3 x in last 2 years. Pt alert &  Oriented.

## 2015-08-09 NOTE — Discharge Instructions (Signed)
Your CT scans did not reveal any acute injuries from the fall. Your hemoglobin today is 7.7 which is consistent with your history of anemia and bone marrow failure. Please follow-up with your doctors at the cancer center for continued monitoring and possible medications to stimulate your bone marrow. Please avoid drinking in the future as this will help improve all of your medical problems and decrease your falling.  Anemia, Nonspecific Anemia is a condition in which the concentration of red blood cells or hemoglobin in the blood is below normal. Hemoglobin is a substance in red blood cells that carries oxygen to the tissues of the body. Anemia results in not enough oxygen reaching these tissues.  CAUSES  Common causes of anemia include:   Excessive bleeding. Bleeding may be internal or external. This includes excessive bleeding from periods (in women) or from the intestine.   Poor nutrition.   Chronic kidney, thyroid, and liver disease.  Bone marrow disorders that decrease red blood cell production.  Cancer and treatments for cancer.  HIV, AIDS, and their treatments.  Spleen problems that increase red blood cell destruction.  Blood disorders.  Excess destruction of red blood cells due to infection, medicines, and autoimmune disorders. SIGNS AND SYMPTOMS   Minor weakness.   Dizziness.   Headache.  Palpitations.   Shortness of breath, especially with exercise.   Paleness.  Cold sensitivity.  Indigestion.  Nausea.  Difficulty sleeping.  Difficulty concentrating. Symptoms may occur suddenly or they may develop slowly.  DIAGNOSIS  Additional blood tests are often needed. These help your health care provider determine the best treatment. Your health care provider will check your stool for blood and look for other causes of blood loss.  TREATMENT  Treatment varies depending on the cause of the anemia. Treatment can include:   Supplements of iron, vitamin B12, or  folic acid.   Hormone medicines.   A blood transfusion. This may be needed if blood loss is severe.   Hospitalization. This may be needed if there is significant continual blood loss.   Dietary changes.  Spleen removal. HOME CARE INSTRUCTIONS Keep all follow-up appointments. It often takes many weeks to correct anemia, and having your health care provider check on your condition and your response to treatment is very important. SEEK IMMEDIATE MEDICAL CARE IF:   You develop extreme weakness, shortness of breath, or chest pain.   You become dizzy or have trouble concentrating.  You develop heavy vaginal bleeding.   You develop a rash.   You have bloody or black, tarry stools.   You faint.   You vomit up blood.   You vomit repeatedly.   You have abdominal pain.  You have a fever or persistent symptoms for more than 2-3 days.   You have a fever and your symptoms suddenly get worse.   You are dehydrated.  MAKE SURE YOU:  Understand these instructions.  Will watch your condition.  Will get help right away if you are not doing well or get worse. Document Released: 01/09/2005 Document Revised: 08/04/2013 Document Reviewed: 05/28/2013 [facility] Patient Information 2015 [place], [place]. This information is not intended to replace advice given to you by your health care provider. Make sure you discuss any questions you have with your health care provider.

## 2015-08-09 NOTE — ED Provider Notes (Addendum)
[facility] Emergency Department Provider Note  ____________________________________________  Time seen: 12:00 PM  I have reviewed the triage vital signs and the nursing notes.   HISTORY  Chief Complaint Fall    HPI Mike Booker is a 79 y.o. male with a history of chronic anemia and alcohol abuse who is brought to the ED from his assisted living facility after having a trip and fall over his walker. He fell over the front of his walker and sustained skin abrasions to the elbows and chest and face. He denies neck pain or loss of consciousness although he did hit his head. No vision changes numbness [place] or weakness. No chest pain or shortness of breath or any preceding symptoms that may have precipitated the fall. He's been in his usual state of health recently. He is known to have chronic issues of alcohol abuse and has frequent falls due to intoxication.     Past Medical History  Diagnosis Date  . Alcoholic cirrhosis   . Thrombocytopenia   . High serum ferritin   . Hepatorenal syndrome     Secondary  . IDA (iron deficiency anemia) 05/24/2015    Patient Active Problem List   Diagnosis Date Noted  . IDA (iron deficiency anemia) 05/24/2015  . Diabetes mellitus, type 2 05/01/2015  . Bone marrow failure 05/01/2015  . ALC (alcoholic liver cirrhosis) 05/01/2015  . Chronic kidney disease (CKD), stage IV (severe) 01/25/2015  . AA (alcohol abuse) 01/25/2015    History reviewed. No pertinent past surgical history.  Current Outpatient Rx  Name  Route  Sig  Dispense  Refill  . furosemide (LASIX) 20 MG tablet   Oral   Take 20 mg by mouth daily.          Marland Kitchen loperamide (IMODIUM) 2 MG capsule   Oral   Take 2 mg by mouth as needed for diarrhea or loose stools.          . metoprolol tartrate (LOPRESSOR) 25 MG tablet   Oral   Take 25 mg by mouth 2 (two) times daily.         . Multiple Vitamin (MULTIVITAMIN WITH MINERALS) TABS tablet   Oral   Take  1 tablet by mouth daily.         Marland Kitchen omeprazole (PRILOSEC) 40 MG capsule   Oral   Take 40 mg by mouth daily.         . cephALEXin (KEFLEX) 500 MG capsule   Oral   Take 1 capsule (500 mg total) by mouth 3 (three) times daily.   21 capsule   0     Allergies Review of patient's allergies indicates no known allergies.  Family History  Problem Relation Age of Onset  . Cancer - Colon Mother 39  . Breast cancer Sister 10  . Prostate cancer Other     Social History Social History  Substance Use Topics  . Smoking status: Former Smoker    Types: Cigarettes    Quit date: 12/16/1965  . Smokeless tobacco: Current User  . Alcohol Use: Yes     Comment: 2 "heavy" drinks per day/    Review of Systems  Constitutional: No fever or chills. No weight changes Eyes:No blurry vision or double vision.  ENT: No sore throat. Cardiovascular: No chest pain. Respiratory: No dyspnea or cough. Gastrointestinal: Negative for abdominal pain, vomiting and diarrhea.  No BRBPR or melena. Genitourinary: Negative for dysuria, urinary retention, bloody urine, or difficulty urinating. Musculoskeletal: Right face  elbow and forearm pain Skin: Negative for rash. Neurological: Negative for headaches, focal weakness or numbness. Psychiatric:No anxiety or depression.   Endocrine:No hot/cold intolerance, changes in energy, or sleep difficulty.  10-point ROS otherwise negative.  ____________________________________________   PHYSICAL EXAM:  VITAL SIGNS: ED Triage Vitals  Enc Vitals Group     BP 08/09/15 1203 96/53 mmHg     Pulse Rate 08/09/15 1203 91     Resp 08/09/15 1203 13     Temp 08/09/15 1203 97.8 F (36.6 C)     Temp Source 08/09/15 1203 Oral     SpO2 08/09/15 1203 95 %     Weight 08/09/15 1203 170 lb (77.111 kg)     Height 08/09/15 1203 5\' 8"  (1.727 m)     Head Cir --      Peak Flow --      Pain Score 08/09/15 1204 1     Pain Loc --      Pain Edu? --      Excl. in GC? --       Constitutional: Alert and oriented. Well appearing and in no distress. Eyes: No scleral icterus. No conjunctival pallor. PERRL. EOMI ENT   Head: Contusion of the right parietal area, small abrasion of her right maxilla. TMs normal.   Nose: No congestion/rhinnorhea. No septal hematoma   Mouth/Throat: MMM, no pharyngeal erythema. No peritonsillar mass. No uvula shift. No intraoral injuries   Neck: No stridor. No SubQ emphysema. No meningismus. No C-spine tenderness, full range of motion Hematological/Lymphatic/Immunilogical: No cervical lymphadenopathy. Cardiovascular: RRR. Normal and symmetric distal pulses are present in all extremities. No murmurs, rubs, or gallops. Respiratory: Normal respiratory effort without tachypnea nor retractions. Breath sounds are clear and equal bilaterally. No wheezes/rales/rhonchi. Gastrointestinal: Soft and nontender. No distention. There is no CVA tenderness.  No rebound, rigidity, or guarding. Genitourinary: deferred Musculoskeletal: Superficial skin tears on the right lateral elbow, right dorsal forearm, and anterior chest. Neurologic:   Normal speech and language.  CN 2-10 normal. Motor grossly intact. No gross focal neurologic deficits are appreciated.  Skin:  Skin is warm, dry and intact. No rash noted.  No petechiae, purpura, or bullae. Psychiatric: Mood and affect are normal. Speech and behavior are normal. Patient exhibits appropriate insight and judgment.  ____________________________________________    LABS (pertinent positives/negatives) (all labs ordered are listed, but only abnormal results are displayed) Labs Reviewed  CBC WITH DIFFERENTIAL/PLATELET - Abnormal; Notable for the following:    RBC 2.12 (*)    Hemoglobin 7.7 (*)    HCT 22.4 (*)    MCV 105.9 (*)    MCH 36.3 (*)    Platelets 123 (*)    Lymphs Abs 0.4 (*)    All other components within normal limits  BASIC METABOLIC PANEL - Abnormal; Notable for the  following:    Sodium 126 (*)    Potassium 5.6 (*)    Chloride 89 (*)    Glucose, Bld 123 (*)    BUN 41 (*)    Creatinine, Ser 3.40 (*)    Calcium 8.7 (*)    GFR calc non Af Amer 16 (*)    GFR calc Af Amer 19 (*)    All other components within normal limits  HEPATIC FUNCTION PANEL - Abnormal; Notable for the following:    Albumin 2.5 (*)    AST 100 (*)    Alkaline Phosphatase 363 (*)    Total Bilirubin 5.0 (*)    Bilirubin, Direct 2.4 (*)  Indirect Bilirubin 2.6 (*)    All other components within normal limits  URINALYSIS COMPLETEWITH MICROSCOPIC ([facility] ONLY) - Abnormal; Notable for the following:    Color, Urine AMBER (*)    APPearance CLEAR (*)    Bilirubin Urine 1+ (*)    Ketones, ur TRACE (*)    Leukocytes, UA 2+ (*)    Bacteria, UA MANY (*)    Squamous Epithelial / LPF 0-5 (*)    All other components within normal limits  URINE CULTURE   ____________________________________________   EKG  Interpreted by me Normal sinus rhythm rate of 85, normal axis and intervals. T-wave inversions in V2 and V3. No acute ST changes. This is unchanged compared to EKG on 05/06/2013  ____________________________________________    RADIOLOGY  CT head and face unremarkable.  ____________________________________________   PROCEDURES  ____________________________________________   INITIAL IMPRESSION / ASSESSMENT AND PLAN / ED COURSE  Pertinent labs & imaging results that were available during my care of the patient were reviewed by me and considered in my medical decision making (see chart for details).  Patient presents with a mechanical fall. No evidence of intoxication at this time. He has multiple skin abrasions which do not require acute treatment at this time. We'll give him a tetanus shot as he thinks his last one was greater than 5 years ago.   Labs reveal hyponatremia at 126 as well as elevated AST and bilirubins. This is all acute.    ----------------------------------------- 4:34 PM on 08/10/2015 -----------------------------------------  This is a late entry progress needed due to the electronic medical record system to be off Yesterday during the remainder of my shift and evaluation of this patient. After obtaining the lab results, I had an extensive discussion with the patient, spouse, and his daughter who are all the bedside. I noted that his hyponatremia and liver dysfunction could be contributing to his unsteadiness. However, they all note that the patient is essentially bedridden at baseline and only leaves the bed to walk to the bathroom, during which time he uses a walker and is assisted by his wife. He does agree that he has very poor oral intake and eats and drinks only minimal amounts. He does still drink alcohol every day which his wife prepare [place] for him. He understands that he needs to cut back on the drinking and increase his regular food and water intake.  The patient completely refuses to be admitted to the hospital for further evaluation and management. They do live in an assisted living facility, and the daughter confirms that this facility has a temporary visiting nurse program where a nurse will come to the house for times a day to help with ADLs and management. The patient also notes that he is moving to a new more cooperative walker as well as a wheelchair for safety and he has this set up already. Given this discussion, the patient is at his apparent baseline which the patient and family members all agree to. He is hemodynamically stable. He is signed out to the oncoming physician Dr. Langston Masker pending a urinalysis, after which time he can be discharged home. Very low suspicion of cerebral edema or other ill effects of electrolyte abnormalities, no evidence of encephalopathy. ____________________________________________   FINAL CLINICAL IMPRESSION(S) / ED DIAGNOSES  Final diagnoses:  Bone marrow failure   IDA (iron deficiency anemia)  Abrasions of multiple sites  UTI (lower urinary tract infection)   hyponatremia Acute liver failure    Sharman Cheek, MD 08/09/15 1513  Sharman Cheek, MD 08/10/15 1638  Sharman Cheek, MD 08/10/15 [phone]

## 2015-08-09 NOTE — ED Notes (Signed)
Pt discharged home on ambulance after verbalizing understanding of discharge instructions; nad noted.

## 2015-08-09 NOTE — ED Provider Notes (Signed)
  Physical Exam  BP 135/59 mmHg  Pulse 86  Temp(Src) 97.8 F (36.6 C) (Oral)  Resp 20  Ht  (1.727 m)  Wt 170 lb (77.111 kg)  BMI 25.85 kg/m2  SpO2 100%  Physical Exam She resting comfortably at this time. Wounds dressed by patient's nurse and patient does not have any active bleeding. ED Course  Procedures  MDM Sign off of this patient was to follow up with the urinalysis and then to discharge back home. Likely mechanical fall today. Urine is showing signs of infection and the patient is saying that over the past week his urine has been "dark." We'll prescribe Keflex. We'll discharge to home.      Myrna Blazer, MD 08/09/15 [phone]

## 2015-08-13 LAB — URINE CULTURE: Culture: >=100000

## 2015-08-22 ENCOUNTER — Telehealth: Payer: Self-pay | Admitting: *Deleted

## 2015-08-22 NOTE — Telephone Encounter (Signed)
Called to report labs drawn last Wednesday showed HGB has dropped to 6.4. Asking what he is to do, injections vs blood transfusion. He is very weak

## 2015-08-22 NOTE — Telephone Encounter (Signed)
Per L Herring, patient needs to see Hematologist this week. Dr Merlene Pulling will see patient tomorrow in [facility] and have blood transfusion Friday in [place]. Wife agreeable to this and will have patient brought to [facility] for 1130 appt tomorrow and [place] for blood on Friday @ 930

## 2015-08-23 ENCOUNTER — Other Ambulatory Visit: Payer: Self-pay | Admitting: Hematology and Oncology

## 2015-08-23 ENCOUNTER — Inpatient Hospital Stay: Payer: Medicare Other | Attending: Hematology and Oncology

## 2015-08-23 ENCOUNTER — Inpatient Hospital Stay (HOSPITAL_BASED_OUTPATIENT_CLINIC_OR_DEPARTMENT_OTHER): Payer: Medicare Other | Admitting: Hematology and Oncology

## 2015-08-23 DIAGNOSIS — I129 Hypertensive chronic kidney disease with stage 1 through stage 4 chronic kidney disease, or unspecified chronic kidney disease: Secondary | ICD-10-CM | POA: Insufficient documentation

## 2015-08-23 DIAGNOSIS — D696 Thrombocytopenia, unspecified: Secondary | ICD-10-CM | POA: Insufficient documentation

## 2015-08-23 DIAGNOSIS — N184 Chronic kidney disease, stage 4 (severe): Secondary | ICD-10-CM | POA: Diagnosis not present

## 2015-08-23 DIAGNOSIS — D631 Anemia in chronic kidney disease: Secondary | ICD-10-CM | POA: Diagnosis not present

## 2015-08-23 DIAGNOSIS — K767 Hepatorenal syndrome: Secondary | ICD-10-CM | POA: Insufficient documentation

## 2015-08-23 DIAGNOSIS — Z79899 Other long term (current) drug therapy: Secondary | ICD-10-CM

## 2015-08-23 DIAGNOSIS — Z800 Family history of malignant neoplasm of digestive organs: Secondary | ICD-10-CM | POA: Insufficient documentation

## 2015-08-23 DIAGNOSIS — R630 Anorexia: Secondary | ICD-10-CM | POA: Insufficient documentation

## 2015-08-23 DIAGNOSIS — D509 Iron deficiency anemia, unspecified: Secondary | ICD-10-CM | POA: Insufficient documentation

## 2015-08-23 DIAGNOSIS — R17 Unspecified jaundice: Secondary | ICD-10-CM | POA: Insufficient documentation

## 2015-08-23 DIAGNOSIS — R5383 Other fatigue: Secondary | ICD-10-CM | POA: Diagnosis not present

## 2015-08-23 DIAGNOSIS — Z87891 Personal history of nicotine dependence: Secondary | ICD-10-CM | POA: Insufficient documentation

## 2015-08-23 DIAGNOSIS — K2270 Barrett's esophagus without dysplasia: Secondary | ICD-10-CM | POA: Diagnosis not present

## 2015-08-23 DIAGNOSIS — Z8042 Family history of malignant neoplasm of prostate: Secondary | ICD-10-CM | POA: Insufficient documentation

## 2015-08-23 DIAGNOSIS — R531 Weakness: Secondary | ICD-10-CM

## 2015-08-23 DIAGNOSIS — K7030 Alcoholic cirrhosis of liver without ascites: Secondary | ICD-10-CM | POA: Diagnosis not present

## 2015-08-23 DIAGNOSIS — D619 Aplastic anemia, unspecified: Secondary | ICD-10-CM

## 2015-08-23 DIAGNOSIS — K219 Gastro-esophageal reflux disease without esophagitis: Secondary | ICD-10-CM

## 2015-08-23 DIAGNOSIS — D61818 Other pancytopenia: Secondary | ICD-10-CM

## 2015-08-23 DIAGNOSIS — K7031 Alcoholic cirrhosis of liver with ascites: Secondary | ICD-10-CM

## 2015-08-23 LAB — CBC WITH DIFFERENTIAL/PLATELET
Basophils Absolute: 0 10*3/uL (ref 0–0.1)
Basophils Relative: 1 %
Eosinophils Absolute: 0 10*3/uL (ref 0–0.7)
Eosinophils Relative: 0 %
HCT: 20.3 % — ABNORMAL LOW (ref 40.0–52.0)
Hemoglobin: 6.9 g/dL — ABNORMAL LOW (ref 13.0–18.0)
Lymphocytes Relative: 27 %
Lymphs Abs: 1 10*3/uL (ref 1.0–3.6)
MCH: 37.1 pg — ABNORMAL HIGH (ref 26.0–34.0)
MCHC: 33.8 g/dL (ref 32.0–36.0)
MCV: 109.8 fL — ABNORMAL HIGH (ref 80.0–100.0)
Monocytes Absolute: 0.3 10*3/uL (ref 0.2–1.0)
Monocytes Relative: 8 %
Neutro Abs: 2.4 10*3/uL (ref 1.4–6.5)
Neutrophils Relative %: 64 %
Platelets: 181 10*3/uL (ref 150–440)
RBC: 1.85 MIL/uL — ABNORMAL LOW (ref 4.40–5.90)
RDW: 16.1 % — ABNORMAL HIGH (ref 11.5–14.5)
WBC: 3.7 10*3/uL — ABNORMAL LOW (ref 3.8–10.6)

## 2015-08-23 LAB — SEDIMENTATION RATE: Sed Rate: >140 mm/hr — ABNORMAL HIGH (ref 0–20)

## 2015-08-23 LAB — IRON AND TIBC
Iron: 129 ug/dL (ref 45–182)
Saturation Ratios: 72 % — ABNORMAL HIGH (ref 17.9–39.5)
TIBC: 178 ug/dL — ABNORMAL LOW (ref 250–450)
UIBC: 49 ug/dL

## 2015-08-23 LAB — FERRITIN: Ferritin: 1420 ng/mL — ABNORMAL HIGH (ref 24–336)

## 2015-08-23 LAB — RETICULOCYTES
RBC.: 1.85 MIL/uL — ABNORMAL LOW (ref 4.40–5.90)
Retic Count, Absolute: 81.4 K/uL (ref 19.0–183.0)
Retic Ct Pct: 4.4 % — ABNORMAL HIGH (ref 0.4–3.1)

## 2015-08-23 LAB — FOLATE: Folate: 34 ng/mL

## 2015-08-23 NOTE — Progress Notes (Signed)
[facility]  Clinic day:  08/23/2015  Chief Complaint: Mike Booker is a 79 y.o. male  with anemia and thrombocytopenia who is seen for reassessment.  HPI: The patient has a history of alcohol use. He drinks several glasses of scotch a day. Notes from Dr. Lorre Nick reveal alcohol cirrhosis.  Patient was initially seen by Dr. Caryl Never on 10/15/2013 for anemia.  He has a  history of reflux and Barrett's esophagus. He underwent testing.  Kappa and lambda free light chains on 11/22/2013 were elevated (70 range) but with a normal ratio(1.082). HIV and RPR were negative on 05/06/2013. Hepatitis C was negative on 12/21/2013. B12 was low normal (259) on 10/15/2013.  HIV testing and RPR were negative on 05/06/2013. AFP was 0.8 on 12/21/2013.  Ferritin was 1287 with iron saturation of 69% on 06/22/2015  He was released by Dr. Lorre Nick on 05/02/2014 secondary to his stable counts. It was recommended that free light chains as well as SPEP be repeated.  Consideration was also made for Procrit for symptomatic anemia. He required yearly AFP and ultrasound for surveillance of hepatocellular carcinoma.  He received a transfusion of PRBCs on 05//09/2015 with hemoglobin increasing from 7.2 to 10.2.  Abdominal MRI without contrast on 09/15/2013 revealed bilateral small effusions without mass and a small liver consistent with cirrhosis. EGD and biopsy by Dr Bluford Kaufmann on 09/13/2013 revealed Barrett's, with goblet cell metaplasia.  Ultrasound on 08/02/2013 revealed cirrhotic changes in the liver and small ascites.  He was seen by [name] on 06/22/2015. At that time, he noted fatigue, leg swelling, and generalized weakness. He was drinking 2-3 glasses of scotch a day. Labs included a hematocrit of 28.2, hemoglobin 9.3, MCV 105.6, platelets 9000, white count 5000 with an ANC of 3600. Iron studies included a ferritin of 1287, iron saturation of 69%.  He was seen in the emergency room on 08/09/2015  following a fall.  He sustained some skin abrasions to his elbows, chest, and face. Notes indicate that he has had frequent falls due to intoxication. Labs on 08/09/2015 revealed a hematocrit 22.4, hemoglobin 7.7, MCV 105.9, platelets 123,000, white count 6200 with an ANC of 5400.  Sodium was 126, potassium 5.6, creatinine 3.4 (creatinine clearance 16-19 ml/minute). Alkaline phosphatase was 363 with an albumen of 2.5. AST and ALT were 151 and 51, respectively. Bilirubin was 5 with a direct fraction of 2.4. Urinalysis revealed reveald 2+ leukocytes and 6-30 white cells. Urine culture grew Escherichia coli, pansensitive. He refused admission .  He was discharged home on Keflex.  He states that his diet is good lately. However he does not he often does not eat lunch. He eats a quarter April. He drinks daily and has been drinking for the past 50 years.  Symptomatically, he states that he has been weaker since 08/09/2015.  He is been essentially bedridden for some time. He states everything tastes heavier. He has anorexia. He notes weight loss of 82 pounds over the past 6 years and (252 pounds to 270 pounds). He notes fatigue. He denies any pain.  Past Medical History  Diagnosis Date  . Alcoholic cirrhosis   . Thrombocytopenia   . High serum ferritin   . Hepatorenal syndrome     Secondary  . IDA (iron deficiency anemia) 05/24/2015    No past surgical history on file.  Family History  Problem Relation Age of Onset  . Cancer - Colon Mother 42  . Breast cancer Sister 71  . Prostate  cancer Other     Social History:  reports that he quit smoking about 49 years ago. His smoking use included Cigarettes. He uses smokeless tobacco. He reports that he drinks alcohol. His drug history is not on file.  he states that he drinks 2 alcoholic beverages a day. His first drink is early in the morning after breakfast and his last drink is about 5:00 to 6:00 in the evening after dinner. He lives at [facility] [address].   Allergies: No Known Allergies  Current Medications: Current Outpatient Prescriptions  Medication Sig Dispense Refill  . furosemide (LASIX) 20 MG tablet Take 20 mg by mouth daily.     Marland Kitchen loperamide (IMODIUM) 2 MG capsule Take 2 mg by mouth as needed for diarrhea or loose stools.     . metoprolol tartrate (LOPRESSOR) 25 MG tablet Take 25 mg by mouth 2 (two) times daily.    . Multiple Vitamin (MULTIVITAMIN WITH MINERALS) TABS tablet Take 1 tablet by mouth daily.    Marland Kitchen omeprazole (PRILOSEC) 40 MG capsule Take 40 mg by mouth daily.     No current facility-administered medications for this visit.    Review of Systems:  GENERAL:  Fatigue.  Minimal activity.  Bed to bathroom with assistance.  No fevers or sweats.  Weight loss of 82# in 6 years. PERFORMANCE STATUS (ECOG):  3 HEENT:  Food tastes "heavy".  Hard of hearing.  No visual changes, runny nose, sore throat, mouth sores or tenderness. Lungs: No shortness of breath or cough.  No hemoptysis. Cardiac:  No chest pain, palpitations, orthopnea, or PND. GI:  Anorexia.  No nausea, vomiting, diarrhea, constipation, melena or hematochezia. GU:  No urgency, frequency, dysuria, or hematuria. Musculoskeletal:  No back pain.  No joint pain.  No muscle tenderness. Extremities:  No pain or swelling. Skin:  No rashes or skin changes. Neuro:  Familial tremor.  No headache, numbness or weakness, balance or coordination issues. Endocrine:  No diabetes, thyroid issues, hot flashes or night sweats. Psych:  No mood changes, depression or anxiety. Pain:  No focal pain. Review of systems:  All other systems reviewed and found to be negative.   Physical Exam: Blood pressure 132/65, pulse 102, temperature 97.8 F (36.6 C), temperature source Tympanic, resp. rate 20, SpO2 98 %. GENERAL:  Thin chronically ill appearing gentleman sitting comfortably in a wheelchair under a blanket in the exam room in no acute distress. MENTAL STATUS:  Alert and  oriented to person, place and time. HEAD:  Normocephalic, atraumatic, face symmetric, no Cushingoid features. EYES:  Gold rimmed glasses. Pupils equal round and reactive to light and accomodation.  No conjunctivitis or scleral icterus. ENT:  Oropharynx clear without lesion.  Tongue normal. Mucous membranes moist.  RESPIRATORY:  Clear to auscultation without rales, wheezes or rhonchi. CARDIOVASCULAR:  Regular rate and rhythm without murmur, rub or gallop. ABDOMEN:  Soft, non-tender, with active bowel sounds, and no hepatomegaly.  Splenomegaly.  No masses. SKIN:  Jaundice and ecchymosis .  No rashes, ulcers or lesions. EXTREMITIES: No edema, no skin discoloration or tenderness.  No palpable cords. LYMPH NODES: No palpable cervical, supraclavicular, axillary or inguinal adenopathy  NEUROLOGICAL: Unremarkable. PSYCH:  Appropriate.  Appointment on 08/23/2015  Component Date Value Ref Range Status  . WBC 08/23/2015 3.7* 3.8 - 10.6 K/uL Final  . RBC 08/23/2015 1.85* 4.40 - 5.90 MIL/uL Final  . Hemoglobin 08/23/2015 6.9* 13.0 - 18.0 g/dL Final  . HCT 16/09/9603 20.3* 40.0 - 52.0 % Final  .  MCV 08/23/2015 109.8* 80.0 - 100.0 fL Final  . MCH 08/23/2015 37.1* 26.0 - 34.0 pg Final  . MCHC 08/23/2015 33.8  32.0 - 36.0 g/dL Final  . RDW 16/09/9603 16.1* 11.5 - 14.5 % Final  . Platelets 08/23/2015 181  150 - 440 K/uL Final  . Neutrophils Relative % 08/23/2015 64%   Final  . Neutro Abs 08/23/2015 2.4  1.4 - 6.5 K/uL Final  . Lymphocytes Relative 08/23/2015 27%   Final  . Lymphs Abs 08/23/2015 1.0  1.0 - 3.6 K/uL Final  . Monocytes Relative 08/23/2015 8%   Final  . Monocytes Absolute 08/23/2015 0.3  0.2 - 1.0 K/uL Final  . Eosinophils Relative 08/23/2015 0%   Final  . Eosinophils Absolute 08/23/2015 0.0  0 - 0.7 K/uL Final  . Basophils Relative 08/23/2015 1%   Final  . Basophils Absolute 08/23/2015 0.0  0 - 0.1 K/uL Final  . Retic Ct Pct 08/23/2015 4.4* 0.4 - 3.1 % Final  . RBC. 08/23/2015 1.85*  4.40 - 5.90 MIL/uL Final  . Retic Count, Manual 08/23/2015 81.4  19.0 - 183.0 K/uL Final  . Ferritin 08/23/2015 1420* 24 - 336 ng/mL Final  . Iron 08/23/2015 129  45 - 182 ug/dL Final  . TIBC 54/08/8118 178* 250 - 450 ug/dL Final  . Saturation Ratios 08/23/2015 72* 17.9 - 39.5 % Final  . UIBC 08/23/2015 49   Final  . Sed Rate 08/23/2015 >140* 0 - 20 mm/hr Final  . Folate 08/23/2015 34.0  >5.9 ng/mL Final  . ABO/RH(D) 08/23/2015 O NEG   Final  . Antibody Screen 08/23/2015 NEG   Final  . Sample Expiration 08/23/2015 08/26/2015   Final    Assessment:  Mike Booker is a 79 y.o. male stage IV chronic kidney disease and alcoholic cirrhosis. Diet is poor. He drinks alcohol daily.  His liver function tests have recently deteriorated.  He recently was seen in the emergency room on 08/09/2015 following a fall.. Creatinine was 3.4 with a creatinine clearance of 16-19 ml/ minute. Phosphatase was 363 with a bilirubin of 5) direct fraction 2.4), AST and ALT were slightly elevated. Albumen was 2.5. Hematocrit was 22.4, hemoglobin 7.7, MCV 105.9 and platelets 123,000. Iron studies on 06/22/2015 revealed a ferritin of 1287 with 69% saturation.  He lives at the [facility] [place]. Performance status is poor. He notes general fatigue.  Exam reveals significant ecchymosis and jaundice.  AFP is 1.3 today.  Plan: 1.  Review entire medical history, diagnoses of anemia and thrombocytopenia related to liver disease/cirrhosis as well as her ongoing alcohol intake. Discuss workup given patient's symptomatology.  Will schedule transfusion of 2 units of packed red blood cells. The patient consented.  2.  Discuss worsening liver function. Discuss imaging with a non-contrasted CT scan. 3.  Discuss renal insufficiency. We discussed of Procrit which been previously discussed with Dr. Gwynneth Albright. 4.  Set up for transfusion  2 units PRBCs on 08/25/2015 in [place].  Patient consented. 5.  Labs today: CBC with differential,  reticulocyte count, type and screen, ferritin, iron studies, sedimentation rate, B12, folate, kappa and lambda free light chains, SPEP, and AFP. 6.  Discuss admission- patient declines. 7.  RTC in 1 week for MD assessment, labs (CBCwith diff, CMP), and review of initial work-up and discussion regarding  direction of therapy.   Rosey Bath, MD  08/23/2015, 11:28 PM

## 2015-08-23 NOTE — Progress Notes (Signed)
Pt is here for anemia F/U visit. He is in a w/c, states he cannot stand very well. Skin is very fragile, bruised on arms. Edema mild in legs. Appetite is poor. Has nausea, abd feels "heavy". Has constipation he goes 3 days or 4 without BMs d/t taking imodium when he gets diarrhea. He states I have stage IV liver disease. He has juandice of skin and schlera. He drinks a scotch and water twice a day. He had labs drawn at [facility] and hgb was 6.4 on 08/16/15. No blood in stools.

## 2015-08-24 LAB — PROTEIN ELECTROPHORESIS, SERUM
A/G Ratio: 0.9 (ref 0.7–1.7)
Albumin ELP: 3 g/dL (ref 2.9–4.4)
Alpha-1-Globulin: 0.2 g/dL (ref 0.0–0.4)
Alpha-2-Globulin: 0.7 g/dL (ref 0.4–1.0)
Beta Globulin: 0.9 g/dL (ref 0.7–1.3)
Gamma Globulin: 1.4 g/dL (ref 0.4–1.8)
Globulin, Total: 3.2 g/dL (ref 2.2–3.9)
Total Protein ELP: 6.2 g/dL (ref 6.0–8.5)

## 2015-08-24 LAB — KAPPA/LAMBDA LIGHT CHAINS
Kappa free light chain: 98.08 mg/L — ABNORMAL HIGH (ref 3.30–19.40)
Kappa, lambda light chain ratio: 1.05 (ref 0.26–1.65)
Lambda free light chains: 93.2 mg/L — ABNORMAL HIGH (ref 5.71–26.30)

## 2015-08-24 LAB — AFP TUMOR MARKER: AFP-Tumor Marker: 1.3 ng/mL (ref 0.0–8.3)

## 2015-08-24 LAB — VITAMIN B12: Vitamin B-12: 324 pg/mL (ref 180–914)

## 2015-08-25 ENCOUNTER — Other Ambulatory Visit: Payer: Self-pay

## 2015-08-25 ENCOUNTER — Other Ambulatory Visit: Payer: Self-pay | Admitting: Hematology and Oncology

## 2015-08-25 ENCOUNTER — Inpatient Hospital Stay: Payer: Medicare Other

## 2015-08-25 ENCOUNTER — Ambulatory Visit: Payer: Self-pay | Admitting: Hematology and Oncology

## 2015-08-25 VITALS — BP 148/83 | HR 92 | Temp 96.1°F | Resp 20

## 2015-08-25 DIAGNOSIS — I129 Hypertensive chronic kidney disease with stage 1 through stage 4 chronic kidney disease, or unspecified chronic kidney disease: Secondary | ICD-10-CM | POA: Diagnosis not present

## 2015-08-25 DIAGNOSIS — D61818 Other pancytopenia: Secondary | ICD-10-CM

## 2015-08-25 LAB — PREPARE RBC (CROSSMATCH)

## 2015-08-25 MED ORDER — SODIUM CHLORIDE 0.9 % IV SOLN
250.0000 mL | Freq: Once | INTRAVENOUS | Status: AC
  Administered 2015-08-25: 250 mL via INTRAVENOUS
  Filled 2015-08-25: qty 250

## 2015-08-25 MED ORDER — DIPHENHYDRAMINE HCL 25 MG PO CAPS
25.0000 mg | ORAL_CAPSULE | Freq: Once | ORAL | Status: AC
  Administered 2015-08-25: 25 mg via ORAL
  Filled 2015-08-25: qty 1

## 2015-08-26 LAB — TYPE AND SCREEN
ABO/RH(D): O NEG
Antibody Screen: NEGATIVE
Unit division: 0
Unit division: 0

## 2015-08-28 ENCOUNTER — Encounter: Payer: Self-pay | Admitting: Hematology and Oncology

## 2015-08-30 ENCOUNTER — Inpatient Hospital Stay (HOSPITAL_BASED_OUTPATIENT_CLINIC_OR_DEPARTMENT_OTHER): Payer: Medicare Other | Admitting: Hematology and Oncology

## 2015-08-30 VITALS — BP 157/75 | HR 75 | Temp 97.1°F | Resp 20

## 2015-08-30 DIAGNOSIS — R531 Weakness: Secondary | ICD-10-CM

## 2015-08-30 DIAGNOSIS — R17 Unspecified jaundice: Secondary | ICD-10-CM

## 2015-08-30 DIAGNOSIS — K7030 Alcoholic cirrhosis of liver without ascites: Secondary | ICD-10-CM

## 2015-08-30 DIAGNOSIS — D631 Anemia in chronic kidney disease: Secondary | ICD-10-CM

## 2015-08-30 DIAGNOSIS — Z79899 Other long term (current) drug therapy: Secondary | ICD-10-CM

## 2015-08-30 DIAGNOSIS — D509 Iron deficiency anemia, unspecified: Secondary | ICD-10-CM | POA: Diagnosis not present

## 2015-08-30 DIAGNOSIS — K767 Hepatorenal syndrome: Secondary | ICD-10-CM

## 2015-08-30 DIAGNOSIS — I129 Hypertensive chronic kidney disease with stage 1 through stage 4 chronic kidney disease, or unspecified chronic kidney disease: Secondary | ICD-10-CM | POA: Diagnosis not present

## 2015-08-30 DIAGNOSIS — D696 Thrombocytopenia, unspecified: Secondary | ICD-10-CM

## 2015-08-30 DIAGNOSIS — E43 Unspecified severe protein-calorie malnutrition: Secondary | ICD-10-CM

## 2015-08-30 DIAGNOSIS — N184 Chronic kidney disease, stage 4 (severe): Secondary | ICD-10-CM | POA: Diagnosis not present

## 2015-08-30 DIAGNOSIS — D61818 Other pancytopenia: Secondary | ICD-10-CM

## 2015-08-30 DIAGNOSIS — R5383 Other fatigue: Secondary | ICD-10-CM

## 2015-08-30 DIAGNOSIS — R630 Anorexia: Secondary | ICD-10-CM

## 2015-08-30 NOTE — Progress Notes (Signed)
[facility]-  Cancer Center  Clinic day:  08/30/2015  Chief Complaint: Mike Booker is a 78 y.o. male with stage IV chronic kidney disease and alcoholic cirrhosis with associated anemia and thrombocytopenia who is seen for 1 week assessment.  HPI: The patient was last seen in the medical oncology clinic on 08/23/2015.  At that time, he was see for initial assessment.  He was noted to have stage IV chronic kidney disease and alcoholic cirrhosis. Diet was poor. He drank alcohol daily.  His liver function tests had recently deteriorated.  ER labs on 08/09/2015 revealed a hematocrit of 22.4, hemoglobin 7.7, MCV 105.9 and platelets 123,000.  Creatinine was 3.4 with a CrCl of 16-19 ml/minute.  Alkaline phosphatase was 363 with a bilirubin of 5 (direct fraction 2.4), AST and ALT were slightly elevated. Albumen was 2.5.   At last visit, I discussed concerns about his ongoing alcohol intake. I discussed admission to the hospital.  He declined.  I discussed transfusion of 2 units of PRBCs on 08/25/2015.  He consented.  Labs on 08/23/2015 revealed a hematocrit of 20.3, hemoglobin 6.9, MCV 109.8, platelets 181,000, white count 3700 with an ANC of 2400. Differential was unremarkable.  SPEP revealed no monoclonal protein.  Kappa free light chains were 98.08, lambda free light chains 93.20 with a 1.05 (normal).  Reticulocyte count was 4.4%. Ferritin was 1420.  Iron studies included a saturation of 72% and TIBC of 178 (low).  Sedimentation rate was greater than 140. B12 was 324. Folate was 34.  AFP was 1.3.  Symptomatically, he states that he tolerated the 2 units of blood.  His strength is a little better. He is able to sit up a little easier. He still cannot walk. He notes that his appetite is poor because of stress.   Past Medical History  Diagnosis Date  . Alcoholic cirrhosis   . Thrombocytopenia   . High serum ferritin   . Hepatorenal syndrome     Secondary  . IDA (iron deficiency  anemia) 05/24/2015    No past surgical history on file.  Family History  Problem Relation Age of Onset  . Cancer - Colon Mother 2  . Breast cancer Sister 69  . Prostate cancer Other     Social History:  reports that he quit smoking about 49 years ago. His smoking use included Cigarettes. He uses smokeless tobacco. He reports that he drinks alcohol. His drug history is not on file.  He states that he drinks 2 alcoholic beverages a day. His first drink is early in the morning after breakfast and his last drink is about 5:00 to 6:00 in the evening after dinner. He lives at [facility] [address]. He is accompanied by his wife.  Allergies: No Known Allergies  Current Medications: Current Outpatient Prescriptions  Medication Sig Dispense Refill  . furosemide (LASIX) 20 MG tablet Take 20 mg by mouth daily.     Marland Kitchen loperamide (IMODIUM) 2 MG capsule Take 2 mg by mouth as needed for diarrhea or loose stools.     . metoprolol tartrate (LOPRESSOR) 25 MG tablet Take 25 mg by mouth 2 (two) times daily.    . Multiple Vitamin (MULTIVITAMIN WITH MINERALS) TABS tablet Take 1 tablet by mouth daily.    Marland Kitchen omeprazole (PRILOSEC) 40 MG capsule Take 40 mg by mouth daily.     No current facility-administered medications for this visit.    Review of Systems:  GENERAL:  Fatigue.  Minimal activity.  Bed to  bathroom with assistance.  No fevers or sweats.  Weight loss of 82# in 6 years. PERFORMANCE STATUS (ECOG):  3 HEENT:  Hard of hearing.  No visual changes, runny nose, sore throat, mouth sores or tenderness. Lungs: No shortness of breath or cough.  No hemoptysis. Cardiac:  No chest pain, palpitations, orthopnea, or PND. GI:  Notes poor appetite because of stress.  No nausea, vomiting, diarrhea, constipation, melena or hematochezia. GU:  No urgency, frequency, dysuria, or hematuria. Musculoskeletal:  No back pain.  No joint pain.  No muscle tenderness. Extremities:  No pain or swelling. Skin:  No rashes or  skin changes. Neuro:  Familial tremor.  No headache, numbness or weakness, balance or coordination issues. Endocrine:  No diabetes, thyroid issues, hot flashes or night sweats. Psych:  No mood changes, depression or anxiety. Pain:  No focal pain. Review of systems:  All other systems reviewed and found to be negative.  Physical Exam: Blood pressure 157/75, pulse 75, temperature 97.1 F (36.2 C), temperature source Tympanic, resp. rate 20, SpO2 100 %. GENERAL:  Thin chronically ill appearing gentleman sitting comfortably in a wheelchair in the exam room in no acute distress. MENTAL STATUS:  Alert and oriented to person, place and time. HEAD:  Normocephalic, atraumatic, face symmetric, no Cushingoid features. EYES:  Gold rimmed glasses. Pupils equal round and reactive to light and accomodation.  No conjunctivitis or scleral icterus. ENT:  Oropharynx clear without lesion.  Tongue normal. Mucous membranes moist.  RESPIRATORY:  Clear to auscultation without rales, wheezes or rhonchi. CARDIOVASCULAR:  Regular rate and rhythm without murmur, rub or gallop. ABDOMEN:  Soft, non-tender, with active bowel sounds, and no hepatomegaly.  Splenomegaly.  No masses. SKIN:  Jaundice, less .  Ecchymosis .  Eschar.  No rashes, ulcers or lesions. EXTREMITIES: No edema, no skin discoloration or tenderness.  No palpable cords. LYMPH NODES: No palpable cervical, supraclavicular, axillary or inguinal adenopathy  NEUROLOGICAL: Unremarkable. PSYCH:  Appropriate.  No visits with results within 3 Day(s) from this visit. Latest known visit with results is:  Infusion on 08/25/2015  Component Date Value Ref Range Status  . Order Confirmation 08/23/2015 ORDER PROCESSED BY BLOOD BANK   Final    Assessment:  Mike Booker is a 79 y.o. male with stage IV chronic kidney disease and alcoholic cirrhosis. Diet is poor. He drinks alcohol daily.  His liver function tests have recently deteriorated.  He was seen in the  emergency room on 08/09/2015 following a fall.. Creatinine was 3.4 with a creatinine clearance of 16-19 ml/minute. Alkaline phosphatase was 363 with a bilirubin of 5 (direct fraction 2.4), AST and ALT were slightly elevated. Albumen was 2.5. Hematocrit was 22.4, hemoglobin 7.7, MCV 105.9 and platelets 123,000. Iron studies on 06/22/2015 revealed a ferritin of 1287 with 69% saturation.  Work-up on 08/23/2015 revealed a hematocrit of 20.3, hemoglobin 6.9, MCV 109.8, platelets 181,000, white count 3700 with an ANC of 2400. Differential was unremarkable.  The following labs were normal:  SPEP, free light chain ratio, retic, B12, folate, and AFP (1.3). Ferritin was 1420.  Iron studies included a saturation of 72% and TIBC of 178 (low).  Sedimentation rate was > 140.  He received 2 units of PRBCs on 08/25/2015.  Symptomatically, his strength is a little better. He is able to sit up a little easier. He still cannot walk. He notes that his appetite is poor because of stress.  Plan: 1.  Discuss work-up.  Discuss ongoing concern about alcohol damage to  the liver.  Discuss renal insufficiency contributing to anemia. 2.  Patient desires appointment in [place].  Please reschedule.  Needs appointments at 2 pm or later. 3.  RTC in 1 month for MD assessment, labs (CBC with diff, CMP, hemochromatosis assay, hold tube).   Rosey Bath, MD  08/30/2015, 12:24 PM

## 2015-08-30 NOTE — Progress Notes (Signed)
F/U for anemia. Pt has stage IV liver disease;cirrhosis. Had a bout of diarrhea, watery stool this am. Stools are dark, not black. Pt is jaundiced. Skin bruised and very fragile. Non ambulatory. Uses wheelchair and bedpan at home. Zero energy. Fatigued. Hgb went to 9 after 2 units of blood. Plts dropped to 85. Denies any active bleeding. Non visible. Wife is with pt today.

## 2015-09-05 ENCOUNTER — Encounter: Payer: Self-pay | Admitting: Hematology and Oncology

## 2015-09-07 ENCOUNTER — Inpatient Hospital Stay: Payer: Medicare Other

## 2015-09-07 ENCOUNTER — Inpatient Hospital Stay: Payer: Medicare Other | Admitting: Hematology and Oncology

## 2015-09-22 ENCOUNTER — Inpatient Hospital Stay: Payer: Medicare Other

## 2015-09-22 ENCOUNTER — Inpatient Hospital Stay: Payer: Medicare Other | Admitting: Hematology and Oncology

## 2015-10-11 ENCOUNTER — Encounter: Payer: Self-pay | Admitting: *Deleted

## 2015-10-11 ENCOUNTER — Inpatient Hospital Stay
Admission: AD | Admit: 2015-10-11 | Discharge: 2015-10-16 | DRG: 682 | Disposition: A | Discharge status: Home / Self Care | Payer: Medicare Other | Source: Ambulatory Visit | Attending: Internal Medicine | Admitting: Internal Medicine

## 2015-10-11 DIAGNOSIS — I120 Hypertensive chronic kidney disease with stage 5 chronic kidney disease or end stage renal disease: Secondary | ICD-10-CM | POA: Diagnosis present

## 2015-10-11 DIAGNOSIS — K7030 Alcoholic cirrhosis of liver without ascites: Secondary | ICD-10-CM | POA: Diagnosis present

## 2015-10-11 DIAGNOSIS — F10188 Alcohol abuse with other alcohol-induced disorder: Secondary | ICD-10-CM | POA: Diagnosis present

## 2015-10-11 DIAGNOSIS — N184 Chronic kidney disease, stage 4 (severe): Secondary | ICD-10-CM | POA: Diagnosis present

## 2015-10-11 DIAGNOSIS — D631 Anemia in chronic kidney disease: Secondary | ICD-10-CM | POA: Diagnosis present

## 2015-10-11 DIAGNOSIS — D61818 Other pancytopenia: Secondary | ICD-10-CM | POA: Diagnosis present

## 2015-10-11 DIAGNOSIS — Z720 Tobacco use: Secondary | ICD-10-CM

## 2015-10-11 DIAGNOSIS — N19 Unspecified kidney failure: Secondary | ICD-10-CM | POA: Diagnosis present

## 2015-10-11 DIAGNOSIS — N186 End stage renal disease: Secondary | ICD-10-CM | POA: Diagnosis present

## 2015-10-11 DIAGNOSIS — E43 Unspecified severe protein-calorie malnutrition: Secondary | ICD-10-CM | POA: Diagnosis present

## 2015-10-11 DIAGNOSIS — Z800 Family history of malignant neoplasm of digestive organs: Secondary | ICD-10-CM | POA: Diagnosis not present

## 2015-10-11 DIAGNOSIS — Z8042 Family history of malignant neoplasm of prostate: Secondary | ICD-10-CM | POA: Diagnosis not present

## 2015-10-11 DIAGNOSIS — E1122 Type 2 diabetes mellitus with diabetic chronic kidney disease: Secondary | ICD-10-CM | POA: Diagnosis present

## 2015-10-11 DIAGNOSIS — Z803 Family history of malignant neoplasm of breast: Secondary | ICD-10-CM

## 2015-10-11 HISTORY — DX: Essential (primary) hypertension: I10

## 2015-10-11 HISTORY — DX: Type 2 diabetes mellitus without complications: E11.9

## 2015-10-11 LAB — CBC
HCT: 25.9 % — ABNORMAL LOW (ref 40.0–52.0)
Hemoglobin: 8.8 g/dL — ABNORMAL LOW (ref 13.0–18.0)
MCH: 35.1 pg — ABNORMAL HIGH (ref 26.0–34.0)
MCHC: 34.1 g/dL (ref 32.0–36.0)
MCV: 103.2 fL — ABNORMAL HIGH (ref 80.0–100.0)
PLATELETS: 72 10*3/uL — AB (ref 150–440)
RBC: 2.51 MIL/uL — ABNORMAL LOW (ref 4.40–5.90)
RDW: 15.2 % — AB (ref 11.5–14.5)
WBC: 3.9 10*3/uL (ref 3.8–10.6)

## 2015-10-11 LAB — BASIC METABOLIC PANEL
ANION GAP: 16 — AB (ref 5–15)
BUN: 39 mg/dL — ABNORMAL HIGH (ref 6–20)
CALCIUM: 8 mg/dL — AB (ref 8.9–10.3)
CO2: 19 mmol/L — ABNORMAL LOW (ref 22–32)
Chloride: 92 mmol/L — ABNORMAL LOW (ref 101–111)
Creatinine, Ser: 3.46 mg/dL — ABNORMAL HIGH (ref 0.61–1.24)
GFR, EST AFRICAN AMERICAN: 18 mL/min — AB (ref >60)
GFR, EST NON AFRICAN AMERICAN: 16 mL/min — AB (ref >60)
Glucose, Bld: 72 mg/dL (ref 65–99)
Potassium: 4.4 mmol/L (ref 3.5–5.1)
Sodium: 127 mmol/L — ABNORMAL LOW (ref 135–145)

## 2015-10-11 MED ORDER — METOPROLOL TARTRATE 25 MG PO TABS
25.0000 mg | ORAL_TABLET | Freq: Two times a day (BID) | ORAL | Status: DC
  Administered 2015-10-11 – 2015-10-16 (×10): 25 mg via ORAL
  Filled 2015-10-11 (×12): qty 1

## 2015-10-11 MED ORDER — ACETAMINOPHEN 650 MG RE SUPP: 650.0000 mg | Freq: Four times a day (QID) | RECTAL | Status: DC | PRN

## 2015-10-11 MED ORDER — ONDANSETRON HCL 4 MG PO TABS: 4.0000 mg | ORAL_TABLET | Freq: Four times a day (QID) | ORAL | Status: DC | PRN

## 2015-10-11 MED ORDER — ONDANSETRON HCL 4 MG/2ML IJ SOLN: 4.0000 mg | Freq: Four times a day (QID) | INTRAMUSCULAR | Status: DC | PRN

## 2015-10-11 MED ORDER — SODIUM CHLORIDE 0.9 % IJ SOLN: 3.0000 mL | INTRAMUSCULAR | Status: DC | PRN

## 2015-10-11 MED ORDER — HEPARIN SOD (PORK) LOCK FLUSH 100 UNIT/ML IV SOLN
250.0000 [IU] | INTRAVENOUS | Status: DC | PRN
  Filled 2015-10-11: qty 3

## 2015-10-11 MED ORDER — ADULT MULTIVITAMIN W/MINERALS CH
1.0000 | ORAL_TABLET | Freq: Every day | ORAL | Status: DC
  Administered 2015-10-11 – 2015-10-16 (×5): 1 via ORAL
  Filled 2015-10-11 (×5): qty 1

## 2015-10-11 MED ORDER — FUROSEMIDE 20 MG PO TABS
20.0000 mg | ORAL_TABLET | Freq: Every day | ORAL | Status: DC
  Administered 2015-10-11 – 2015-10-16 (×5): 20 mg via ORAL
  Filled 2015-10-11 (×5): qty 1

## 2015-10-11 MED ORDER — ACETAMINOPHEN 325 MG PO TABS: 650.0000 mg | ORAL_TABLET | Freq: Four times a day (QID) | ORAL | Status: DC | PRN

## 2015-10-11 MED ORDER — HEPARIN SOD (PORK) LOCK FLUSH 100 UNIT/ML IV SOLN
500.0000 [IU] | Freq: Every day | INTRAVENOUS | Status: DC | PRN
  Filled 2015-10-11: qty 5

## 2015-10-11 MED ORDER — SODIUM CHLORIDE 0.9 % IV SOLN
INTRAVENOUS | Status: DC
  Administered 2015-10-11 – 2015-10-12 (×2): via INTRAVENOUS

## 2015-10-11 MED ORDER — SODIUM CHLORIDE 0.9 % IJ SOLN: 10.0000 mL | INTRAMUSCULAR | Status: DC | PRN

## 2015-10-11 MED ORDER — PANTOPRAZOLE SODIUM 40 MG PO TBEC
40.0000 mg | DELAYED_RELEASE_TABLET | Freq: Every day | ORAL | Status: DC
  Administered 2015-10-13 – 2015-10-16 (×4): 40 mg via ORAL
  Filled 2015-10-11 (×4): qty 1

## 2015-10-11 MED ORDER — CHLORHEXIDINE GLUCONATE CLOTH 2 % EX PADS
6.0000 | MEDICATED_PAD | Freq: Once | CUTANEOUS | Status: AC
  Administered 2015-10-12: 6 via TOPICAL

## 2015-10-11 NOTE — H&P (Signed)
[facility] [place] [facility]   PATIENT NAME: Mike FarrHerman Booker    MR#:  [id number]  DATE OF BIRTH:  10/27/1936  DATE OF ADMISSION:  10/11/2015  PRIMARY CARE PHYSICIAN: Lamont DowdyKOLLURU, SARATH, MD   REQUESTING/REFERRING PHYSICIAN: Dr.:  CHIEF COMPLAINT:  No chief complaint on file.   HISTORY OF PRESENT ILLNESS:  Mike Booker  is a 79 y.o. male with a known history of chronic kidney disease stage III, sent in from nephrology office for worsening renal failure and need for  HD acess. Patient complains of weight gain but the no shortness of breath abdominal pain.has sh/o  alcoholic liver cirrhosis, familial tremors. He is bedridden for the past 2 months secondary to leg weakness, [facility].  PAST MEDICAL HISTORY:   Past Medical History  Diagnosis Date  . Alcoholic cirrhosis (HCC)   . Thrombocytopenia (HCC)   . High serum ferritin   . Hepatorenal syndrome (HCC)     Secondary  . IDA (iron deficiency anemia) 05/24/2015    PAST SURGICAL HISTOIRY:  No past surgical history on file.  SOCIAL HISTORY:   Social History  Substance Use Topics  . Smoking status: Former Smoker    Types: Cigarettes    Quit date: 12/16/1965  . Smokeless tobacco: Current User    Types: Chew  . Alcohol Use: Yes     Comment: 2 "heavy" drinks per day/    FAMILY HISTORY:   Family History  Problem Relation Age of Onset  . Cancer - Colon Mother 6860  . Breast cancer Sister 2155  . Prostate cancer Other     DRUG ALLERGIES:  No Known Allergies  REVIEW OF SYSTEMS: Asking to drink scotch, patient says that he's been drinking  2 bottles of scotch for the  Past  50 years.  CONSTITUTIONAL: No fever, fatigue or weakness.  EYES: No blurred or double vision.  EARS, NOSE, AND THROAT: No tinnitus or ear pain.  RESPIRATORY: No cough, shortness of breath, wheezing or hemoptysis.  CARDIOVASCULAR: No chest pain, orthopnea, edema.  GASTROINTESTINAL: No nausea, vomiting,  diarrhea or abdominal pain.  GENITOURINARY: No dysuria, hematuria.  ENDOCRINE: No polyuria, nocturia,  HEMATOLOGY: No anemia, easy bruising or bleeding SKIN: No rash or lesion. MUSCULOSKELETAL: Bedridden for the past 2 months secondary  Leg muscle  weakness  And  legs giving out.  NEUROLOGIC: No tingling, numbness, weakness.  PSYCHIATRY: No anxiety or depression.   MEDICATIONS AT HOME:   Prior to Admission medications   Medication Sig Start Date End Date Taking? Authorizing Provider  furosemide (LASIX) 20 MG tablet Take 20 mg by mouth daily.    Yes Historical Provider, MD  loperamide (IMODIUM) 2 MG capsule Take 2 mg by mouth as needed for diarrhea or loose stools.    Yes Historical Provider, MD  metoprolol tartrate (LOPRESSOR) 25 MG tablet Take 25 mg by mouth 2 (two) times daily.   Yes Historical Provider, MD  Multiple Vitamin (MULTIVITAMIN WITH MINERALS) TABS tablet Take 1 tablet by mouth daily.   Yes Historical Provider, MD  omeprazole (PRILOSEC) 40 MG capsule Take 40 mg by mouth daily.   Yes Historical Provider, MD      VITAL SIGNS:  Blood pressure 124/67, pulse 98, temperature 98 F (36.7 C), temperature source Oral, resp. rate 17, height 5\' 8"  (1.727 m), weight 71.759 kg (158 lb 3.2 oz), SpO2 99 %.  PHYSICAL EXAMINATION:  GENERAL:  79 y.o.-year-old patient lying in the bed with no  acute distress.  EYES: Pupils equal, round, reactive to light and accommodation. No scleral icterus. Extraocular muscles intact.  HEENT: Head atraumatic, normocephalic. Oropharynx and nasopharynx clear.  NECK:  Supple, no jugular venous distention. No thyroid enlargement, no tenderness.  LUNGS: Normal breath sounds bilaterally, no wheezing, rales,rhonchi or crepitation. No use of accessory muscles of respiration.  CARDIOVASCULAR: S1, S2 normal. No murmurs, rubs, or gallops.  ABDOMEN: Soft, nontender, nondistended. Bowel sounds present. No organomegaly or mass.  EXTREMITIES: No pedal edema, cyanosis, or  clubbing.  NEUROLOGIC: Cranial nerves II through XII are intact. Muscle strength 5/5 in all extremities. Sensation intact. Gait not checked.  PSYCHIATRIC: The patient is alert and oriented x 3.  SKIN: No obvious rash, lesion, or ulcer.   LABORATORY PANEL:   CBC No results for input(s): WBC, HGB, HCT, PLT in the last 168 hours. ------------------------------------------------------------------------------------------------------------------  Chemistries  No results for input(s): NA, K, CL, CO2, GLUCOSE, BUN, CREATININE, CALCIUM, MG, AST, ALT, ALKPHOS, BILITOT in the last 168 hours.  Invalid input(s): GFRCGP ------------------------------------------------------------------------------------------------------------------  Cardiac Enzymes No results for input(s): TROPONINI in the last 168 hours. ------------------------------------------------------------------------------------------------------------------  RADIOLOGY:  No results found.  EKG:   Orders placed or performed during the hospital encounter of 08/09/15  . ED EKG  . ED EKG  . EKG    IMPRESSION AND PLAN:   53. 79 year old male patient with worsening renal failure: Need for hemodialysis access  Nephrology, will consult vascular for permacath placement .do not have baseline labs so I ordered stat labs.  #2 alcoholic liver cirrhosis with thrombocytopenia; continue to drink, discussed about CESSATION and counseled him for 10 minutes. Continue Lasix, metoprolol.  #3 generalized weakness unable to walk , bedridden, uses wheelchair occasionally:  get physical therapy evaluation.   idiscussed the plan with patient's wife. CODE STATUS:full  TOTAL TIME TAKING CARE OF THIS PATIENT: .    Katha Hamming M.D on 10/11/2015 at 4:34 PM  Between 7am to 6pm - Pager - [phone]  After 6pm go to [email or web address] - password [email or web address] [facility]  [place]  [facility]  Office  [phone]  CC: Primary care  physician; Lamont Dowdy, MD  Note: This dictation was prepared with Dragon dictation along with smaller phrase technology. Any transcriptional errors that result from this process are unintentional.

## 2015-10-12 ENCOUNTER — Encounter: Payer: Self-pay | Admitting: *Deleted

## 2015-10-12 ENCOUNTER — Encounter
Admission: AD | Disposition: A | Discharge status: Home / Self Care | Payer: Self-pay | Source: Ambulatory Visit | Attending: Internal Medicine

## 2015-10-12 DIAGNOSIS — E43 Unspecified severe protein-calorie malnutrition: Secondary | ICD-10-CM | POA: Insufficient documentation

## 2015-10-12 HISTORY — PX: PERIPHERAL VASCULAR CATHETERIZATION: SHX172C

## 2015-10-12 LAB — BASIC METABOLIC PANEL
ANION GAP: 12 (ref 5–15)
BUN: 44 mg/dL — AB (ref 6–20)
CO2: 23 mmol/L (ref 22–32)
Calcium: 8 mg/dL — ABNORMAL LOW (ref 8.9–10.3)
Chloride: 96 mmol/L — ABNORMAL LOW (ref 101–111)
Creatinine, Ser: 3.46 mg/dL — ABNORMAL HIGH (ref 0.61–1.24)
GFR calc Af Amer: 18 mL/min — ABNORMAL LOW (ref >60)
GFR calc non Af Amer: 16 mL/min — ABNORMAL LOW (ref >60)
GLUCOSE: 79 mg/dL (ref 65–99)
POTASSIUM: 4.7 mmol/L (ref 3.5–5.1)
SODIUM: 131 mmol/L — AB (ref 135–145)

## 2015-10-12 LAB — CBC
HEMATOCRIT: 23.7 % — AB (ref 40.0–52.0)
Hemoglobin: 8.1 g/dL — ABNORMAL LOW (ref 13.0–18.0)
MCH: 35.2 pg — AB (ref 26.0–34.0)
MCHC: 34 g/dL (ref 32.0–36.0)
MCV: 103.7 fL — AB (ref 80.0–100.0)
Platelets: 51 10*3/uL — ABNORMAL LOW (ref 150–440)
RBC: 2.29 MIL/uL — ABNORMAL LOW (ref 4.40–5.90)
RDW: 14.9 % — AB (ref 11.5–14.5)
WBC: 2.3 10*3/uL — ABNORMAL LOW (ref 3.8–10.6)

## 2015-10-12 SURGERY — DIALYSIS/PERMA CATHETER INSERTION
Anesthesia: Moderate Sedation

## 2015-10-12 MED ORDER — FENTANYL CITRATE (PF) 100 MCG/2ML IJ SOLN
INTRAMUSCULAR | Status: DC | PRN
  Administered 2015-10-12 (×2): 50 ug via INTRAVENOUS

## 2015-10-12 MED ORDER — SODIUM CHLORIDE 0.9 % IV SOLN: 100.0000 mL | INTRAVENOUS | Status: DC | PRN

## 2015-10-12 MED ORDER — CEFAZOLIN SODIUM 1-5 GM-% IV SOLN
1.0000 g | Freq: Once | INTRAVENOUS | Status: AC
  Administered 2015-10-12: 1 g via INTRAVENOUS

## 2015-10-12 MED ORDER — MIDAZOLAM HCL 2 MG/2ML IJ SOLN
INTRAMUSCULAR | Status: DC | PRN
Start: 2015-10-12 — End: 2015-10-12
  Administered 2015-10-12: 2 mg via INTRAVENOUS

## 2015-10-12 MED ORDER — CEFAZOLIN SODIUM 1-5 GM-% IV SOLN
INTRAVENOUS | Status: AC
  Filled 2015-10-12: qty 50

## 2015-10-12 MED ORDER — FENTANYL CITRATE (PF) 100 MCG/2ML IJ SOLN
INTRAMUSCULAR | Status: AC
  Filled 2015-10-12: qty 2

## 2015-10-12 MED ORDER — HEPARIN SODIUM (PORCINE) 1000 UNIT/ML DIALYSIS
1000.0000 [IU] | INTRAMUSCULAR | Status: DC | PRN
  Administered 2015-10-14: 300 [IU] via INTRAVENOUS_CENTRAL
  Filled 2015-10-12 (×2): qty 1

## 2015-10-12 MED ORDER — LIDOCAINE HCL (PF) 1 % IJ SOLN: 5.0000 mL | INTRAMUSCULAR | Status: DC | PRN

## 2015-10-12 MED ORDER — PENTAFLUOROPROP-TETRAFLUOROETH EX AERO: 1.0000 "application " | INHALATION_SPRAY | CUTANEOUS | Status: DC | PRN

## 2015-10-12 MED ORDER — INFLUENZA VAC SPLIT QUAD 0.5 ML IM SUSY
0.5000 mL | PREFILLED_SYRINGE | INTRAMUSCULAR | Status: AC
  Administered 2015-10-13: 0.5 mL via INTRAMUSCULAR
  Filled 2015-10-12: qty 0.5

## 2015-10-12 MED ORDER — MIDAZOLAM HCL 5 MG/5ML IJ SOLN
INTRAMUSCULAR | Status: AC
  Filled 2015-10-12: qty 5

## 2015-10-12 MED ORDER — ALTEPLASE 2 MG IJ SOLR
2.0000 mg | Freq: Once | INTRAMUSCULAR | Status: DC | PRN
  Filled 2015-10-12: qty 2

## 2015-10-12 MED ORDER — LIDOCAINE-EPINEPHRINE (PF) 1 %-1:200000 IJ SOLN
INTRAMUSCULAR | Status: AC
  Filled 2015-10-12: qty 30

## 2015-10-12 MED ORDER — HEPARIN SODIUM (PORCINE) 10000 UNIT/ML IJ SOLN
INTRAMUSCULAR | Status: AC
  Filled 2015-10-12: qty 1

## 2015-10-12 MED ORDER — LIDOCAINE-PRILOCAINE 2.5-2.5 % EX CREA: 1.0000 "application " | TOPICAL_CREAM | CUTANEOUS | Status: DC | PRN

## 2015-10-12 MED ORDER — HEPARIN (PORCINE) IN NACL 2-0.9 UNIT/ML-% IJ SOLN
INTRAMUSCULAR | Status: AC
  Filled 2015-10-12: qty 500

## 2015-10-12 MED ORDER — TUBERCULIN PPD 5 UNIT/0.1ML ID SOLN
5.0000 [IU] | Freq: Once | INTRADERMAL | Status: AC
  Administered 2015-10-12: 5 [IU] via INTRADERMAL
  Filled 2015-10-12: qty 0.1

## 2015-10-12 SURGICAL SUPPLY — 9 items
BIOPATCH RED 1 DISK 7.0 (GAUZE/BANDAGES/DRESSINGS) ×2 IMPLANT
BIOPATCH RED 1IN DISK 7.0MM (GAUZE/BANDAGES/DRESSINGS) ×1
CATH PALINDROME RT-P 15FX19CM (CATHETERS) ×3 IMPLANT
DERMABOND ADVANCED (GAUZE/BANDAGES/DRESSINGS) ×2
DERMABOND ADVANCED .7 DNX12 (GAUZE/BANDAGES/DRESSINGS) ×1 IMPLANT
PACK ANGIOGRAPHY (CUSTOM PROCEDURE TRAY) ×3 IMPLANT
SUT MON AB 4-0 PC3 18 (SUTURE) ×3 IMPLANT
SUT PROLENE 0 CT 1 30 (SUTURE) ×3 IMPLANT
TOWEL OR 17X26 4PK STRL BLUE (TOWEL DISPOSABLE) ×3 IMPLANT

## 2015-10-12 NOTE — Progress Notes (Signed)
Initial Nutrition Assessment  DOCUMENTATION CODES:   Severe malnutrition in context of chronic illness  INTERVENTION:  Meals and snacks: Recommend renal diet once able to take po intake Nutrition diet education: Will follow-up with renal diet education on next visit Medical Nutrition Supplement therapy: May need to consider adding nepro if unable to meet nutritional needs   NUTRITION DIAGNOSIS:   Inadequate oral intake related to acute illness as evidenced by per patient/family report    GOAL:   Patient will meet greater than or equal to 90% of their needs    MONITOR:    (Energy intake, Electrolyte and renal profile, anthropometric)  REASON FOR ASSESSMENT:   Diagnosis    ASSESSMENT:      Pt admitted for HD access and begin dialysis  Past Medical History  Diagnosis Date  . Alcoholic cirrhosis (HCC)   . Thrombocytopenia (HCC)   . High serum ferritin   . Hepatorenal syndrome (HCC)     Secondary  . IDA (iron deficiency anemia) 05/24/2015  . Hypertension   . Diabetes mellitus without complication (HCC) pt. states that he no longer has it    Current Nutrition: NPO  Food/Nutrition-Related History: pt reports no po intake for the past day and 1/2 but prior to that typically eats good breakfast and very little for lunch and dinner.  Has been eating this way for "awhile"   Scheduled Medications:  . furosemide  20 mg Oral Daily  . [START ON 10/13/2015] Influenza vac split quadrivalent PF  0.5 mL Intramuscular Tomorrow-1000  . metoprolol tartrate  25 mg Oral BID  . multivitamin with minerals  1 tablet Oral Daily  . pantoprazole  40 mg Oral QAC breakfast  . tuberculin  5 Units Intradermal Once    Continuous Medications:  . sodium chloride 20 mL/hr at 10/11/15 2222     Electrolyte/Renal Profile and Glucose Profile:   Recent Labs Lab 10/11/15 1625 10/12/15 0442  NA 127* 131*  K 4.4 4.7  CL 92* 96*  CO2 19* 23  BUN 39* 44*  CREATININE 3.46* 3.46*   CALCIUM 8.0* 8.0*  GLUCOSE 72 79    Gastrointestinal Profile: Last BM: 10/27    Nutrition-Focused Physical Exam Findings: Nutrition-Focused physical exam completed. Findings are normal fat depletion, normal-mild/moderate muscle depletion, and none edema.   Pt mostly bedridden, up to wheelchair at most  Weight Change: 10% weight loss in the last 5 months Per wt encounter 158 pounds (10/11/15) 170 pounds (08/09/15) 175 pounds (05/01/15)   Diet Order:  Diet NPO time specified  Skin:   reviewed   Height:   Ht Readings from Last 1 Encounters:  10/11/15 5\' 8"  (1.727 m)    Weight:   Wt Readings from Last 1 Encounters:  10/11/15 158 lb 3.2 oz (71.759 kg)      BMI:  Body mass index is 24.06 kg/(m^2).  Estimated Nutritional Needs:   Kcal:  BEE 1419 kcals (IF 1.2-1.5, AF 1.3) 2213-2767 kcals/d  Protein:  (1.2-1.5 g/kg) 86-108 g/d  Fluid:  (1000ml + UOP)  EDUCATION NEEDS:   No education needs identified at this time  HIGH Care Level  Tonishia Steffy B. Freida BusmanAllen, RD, LDN [phone] (pager)

## 2015-10-12 NOTE — Care Management Note (Signed)
I will monitor admission for possible need for outpatient dialysis at discharge.  Mike ReiningKim Beryle Bagsby  Dialysis Liaison  [phone]

## 2015-10-12 NOTE — Progress Notes (Signed)
Pt doing well post procedure,taking pt to dialysis after recovery period, [facility] RN on floor with plan reviewed, vss at this time, pt. Alert and oriented post procedure, afebrile. Dressing dry and intact to right permcath site,

## 2015-10-12 NOTE — Consult Note (Signed)
[facility] Vascular Consult Note  MRN : [id number]  Mike Booker is a 79 y.o. (12/30/1935) male who presents with chief complaint of No chief complaint on file. Marland Kitchen.  History of Present Illness: Patient admitted to the hospital with worsening uremic symptoms and known severe kidney disease. He has had long-standing chronic kidney disease which has progressed over time. His most recent creatinine clearance was 11. Now with worsening appetite, fatigue, and low energy level it is felt that he has uremic symptoms and needs to start on dialysis for end-stage renal disease. His symptoms have progressed over weeks to months. Patient currently has no dialysis access, and we are asked to get a PermCath placed today.  Current Facility-Administered Medications  Medication Dose Route Frequency Provider Last Rate Last Dose  . 0.9 %  sodium chloride infusion   Intravenous Continuous Annice NeedyJason S Dew, MD 20 mL/hr at 10/12/15 1451    . acetaminophen (TYLENOL) tablet 650 mg  650 mg Oral Q6H PRN Katha HammingSnehalatha Konidena, MD       Or  . acetaminophen (TYLENOL) suppository 650 mg  650 mg Rectal Q6H PRN Katha HammingSnehalatha Konidena, MD      . furosemide (LASIX) tablet 20 mg  20 mg Oral Daily Katha HammingSnehalatha Konidena, MD   20 mg at 10/11/15 1612  . heparin lock flush 100 unit/mL  500 Units Intracatheter Daily PRN Rosey BathMelissa C Corcoran, MD      . heparin lock flush 100 unit/mL  250 Units Intracatheter PRN Rosey BathMelissa C Corcoran, MD      . Melene Muller[START ON 10/13/2015] Influenza vac split quadrivalent PF (FLUARIX) injection 0.5 mL  0.5 mL Intramuscular Tomorrow-1000 Katha HammingSnehalatha Konidena, MD      . metoprolol tartrate (LOPRESSOR) tablet 25 mg  25 mg Oral BID Katha HammingSnehalatha Konidena, MD   25 mg at 10/12/15 1135  . multivitamin with minerals tablet 1 tablet  1 tablet Oral Daily Katha HammingSnehalatha Konidena, MD   1 tablet at 10/11/15 1612  . ondansetron (ZOFRAN) tablet 4 mg  4 mg Oral Q6H PRN Katha HammingSnehalatha Konidena, MD       Or  . ondansetron (ZOFRAN)  injection 4 mg  4 mg Intravenous Q6H PRN Katha HammingSnehalatha Konidena, MD      . pantoprazole (PROTONIX) EC tablet 40 mg  40 mg Oral QAC breakfast Katha HammingSnehalatha Konidena, MD   40 mg at 10/12/15 0902  . sodium chloride 0.9 % injection 10 mL  10 mL Intracatheter PRN Rosey BathMelissa C Corcoran, MD      . sodium chloride 0.9 % injection 3 mL  3 mL Intracatheter PRN Rosey BathMelissa C Corcoran, MD      . tuberculin injection 5 Units  5 Units Intradermal Once Munsoor Lateef, MD   5 Units at 10/12/15 1407    Past Medical History  Diagnosis Date  . Alcoholic cirrhosis (HCC)   . Thrombocytopenia (HCC)   . High serum ferritin   . Hepatorenal syndrome (HCC)     Secondary  . IDA (iron deficiency anemia) 05/24/2015  . Hypertension   . Diabetes mellitus without complication (HCC) pt. states that he no longer has it    Past Surgical History  Procedure Laterality Date  . Tonsillectomy    . Fracture surgery  left ankle    still has screws    Social History Social History  Substance Use Topics  . Smoking status: Former Smoker    Types: Cigarettes    Quit date: 12/16/1965  . Smokeless tobacco: Current User    Types: Chew  .  Alcohol Use: Yes     Comment: 2 "heavy" drinks per day/   no IV drug use  Family History Family History  Problem Relation Age of Onset  . Cancer - Colon Mother 3  . Breast cancer Sister 64  . Prostate cancer Other    no history of bleeding disorders or clotting disorders  No Known Allergies   REVIEW OF SYSTEMS (Negative unless checked)  Constitutional: Weight loss  Fever  Chills Cardiac: Chest pain   Chest pressure   Palpitations   Shortness of breath when laying flat   Shortness of breath at rest   Shortness of breath with exertion. Vascular:  Pain in legs with walking   Pain in legs at rest   Pain in legs when laying flat   Claudication   Pain in feet when walking  Pain in feet at rest  Pain in feet when laying flat   History of DVT   Phlebitis    Swelling in legs   Varicose veins   Non-healing ulcers Pulmonary:   Uses home oxygen   Productive cough   Hemoptysis   Wheeze  COPD   Asthma Neurologic:  Dizziness  Blackouts   Seizures   History of stroke   History of TIA  Aphasia   Temporary blindness   Dysphagia   Weakness or numbness in arms   Weakness or numbness in legs Musculoskeletal:  Arthritis   Joint swelling   Joint pain   Low back pain Hematologic:  Easy bruising  Easy bleeding   Hypercoagulable state   Anemic  Hepatitis Gastrointestinal:  Blood in stool   Vomiting blood  Gastroesophageal reflux/heartburn   Difficulty swallowing. Genitourinary:  Chronic kidney disease   Difficult urination  Frequent urination  Burning with urination   Blood in urine Skin:  Rashes   Ulcers   Wounds Psychological:  History of anxiety    History of major depression.  Physical Examination  Filed Vitals:   10/11/15 2219 10/12/15 0829 10/12/15 1342 10/12/15 1439  BP: 129/48 158/61 137/64 160/71  Pulse: 72 72 64 66  Temp:  98.1 F (36.7 C)  98.2 F (36.8 C)  TempSrc:  Oral  Oral  Resp:   16 15  Height:      Weight:      SpO2:  100% 100% 100%   Body mass index is 24.06 kg/(m^2). Gen:  WD/WN, NAD Head: Shafer/AT, No temporalis wasting. Prominent temp pulse not noted. Ear/Nose/Throat: Hearing grossly intact, nares w/o erythema or drainage, oropharynx w/o Erythema/Exudate Eyes: PERRLA, EOMI.  Neck: Supple, no nuchal rigidity.  No bruit or JVD.  Pulmonary:  Good air movement, clear to auscultation bilaterally.  Cardiac: RRR, normal S1, S2, no Murmurs, rubs or gallops. Vascular:  Vessel Right Left  Radial Palpable Palpable                                   Gastrointestinal: soft, non-tender/non-distended. No guarding/reflex. No masses, surgical incisions, or scars. Musculoskeletal: M/S 5/5 throughout.  Extremities without ischemic changes.  No deformity or  atrophy. No edema. Neurologic: CN 2-12 intact. Pain and light touch intact in extremities.  Symmetrical.  Speech is fluent. Motor exam as listed above. Psychiatric: Judgment intact, Mood & affect appropriate for pt's clinical situation. Dermatologic: No rashes or ulcers noted.  Skin with lots of bruising in the arms. Lymph : No Cervical, Axillary, or Inguinal lymphadenopathy.    CBC  Lab Results  Component Value Date   WBC 2.3* 10/12/2015   HGB 8.1* 10/12/2015   HCT 23.7* 10/12/2015   MCV 103.7* 10/12/2015   PLT 51* 10/12/2015    BMET    Component Value Date/Time   NA 131* 10/12/2015 0442   NA 139 12/21/2013 1412   K 4.7 10/12/2015 0442   K 3.9 12/21/2013 1412   CL 96* 10/12/2015 0442   CL 100 12/21/2013 1412   CO2 23 10/12/2015 0442   CO2 26 12/21/2013 1412   GLUCOSE 79 10/12/2015 0442   GLUCOSE 131* 12/21/2013 1412   BUN 44* 10/12/2015 0442   BUN 62* 12/21/2013 1412   CREATININE 3.46* 10/12/2015 0442   CREATININE 4.59* 12/21/2013 1412   CALCIUM 8.0* 10/12/2015 0442   CALCIUM 9.6 12/21/2013 1412   GFRNONAA 16* 10/12/2015 0442   GFRNONAA 12* 12/21/2013 1412   GFRAA 18* 10/12/2015 0442   GFRAA 13* 12/21/2013 1412   Estimated Creatinine Clearance: 17.3 mL/min (by C-G formula based on Cr of 3.46).  COAG Lab Results  Component Value Date   INR 1.3 05/06/2013    Radiology No results found.    Assessment/Plan 1. ESRD. Has had long-standing chronic kidney disease but now has uremic symptoms with poor appetite and a creatinine clearance of 11. Initiation of dialysis currently. We'll plan PermCath placement today. Risks and benefits were discussed. Will ultimately need permanent dialysis access in the form of a fistula or graft more than likely. 2. Anemia of chronic disease. Will get Procrit with dialysis and transfuse as necessary. 3. Hypertension. Has a history of orthostatic hypotension with tight blood pressure control. To be monitored closely in the  hospital.   Festus Barren, MD  10/12/2015 3:06 PM

## 2015-10-12 NOTE — Op Note (Signed)
OPERATIVE NOTE    PRE-OPERATIVE DIAGNOSIS: 1. ESRD 2. HTN  POST-OPERATIVE DIAGNOSIS: same as above  PROCEDURE: 1. Ultrasound guidance for vascular access to the right internal jugular vein 2. Fluoroscopic guidance for placement of catheter 3. Placement of a 19 cm tip to cuff tunneled hemodialysis catheter via the right internal jugular vein  SURGEON: Festus BarrenJason Sohail Capraro, MD  ANESTHESIA:  Local/MCS  ESTIMATED BLOOD LOSS:  minimal  FINDING(S): 1.  Patent right internal jugular vein  SPECIMEN(S):  None  INDICATIONS:   Mike Booker is a 79 y.o. male who presents with chronic kidney disease that has progressed to ESRD.  The patient needs long term dialysis access for their ESRD, and a Permcath is necessary.  Risks and benefits are discussed and informed consent is obtained.    DESCRIPTION: After obtaining full informed written consent, the patient was brought back to the vascular suited. The patient's right neck and chest were sterilely prepped and draped in a sterile surgical field was created.  The right internal jugular vein was visualized with ultrasound and found to be patent. It was then accessed under direct ultrasound guidance and a permanent image was recorded. A wire was placed. After skin nick and dilatation, the peel-away sheath was placed over the wire. I then turned my attention to an area under the clavicle. Approximately 1-2 fingerbreadths below the clavicle a small counterincision was created and tunneled from the subclavicular incision to the access site. Using fluoroscopic guidance, a 19 centimeter tip to cuff tunneled hemodialysis catheter was selected, and tunneled from the subclavicular incision to the access site. It was then placed through the peel-away sheath and the peel-away sheath was removed. Using fluoroscopic guidance the catheter tips were parked in the right atrium. The appropriate distal connectors were placed. It withdrew blood well and flushed easily with  heparinized saline and a concentrated heparin solution was then placed. It was secured to the chest wall with 2 Prolene sutures. The access incision was closed single 4-0 Monocryl. A 4-0 Monocryl pursestring suture was placed around the exit site. Sterile dressings were placed. The patient tolerated the procedure well and was taken to the recovery room in stable condition.  COMPLICATIONS: None  CONDITION: Stable  [name]  10/12/2015, 3:53 PM

## 2015-10-12 NOTE — Progress Notes (Signed)
[facility]  ROUNDING NOTE   Subjective:  Pt well known to us. Followed by Dr. Wynelle LinkKolluru for CKD stage V/ESRD. He had a CrCl performed on 09/17/15 which came back at 11. Pt has had poor appetite recently as well. Also has increasing weakness. Due for permcath placement today.   Objective:  Vital signs in last 24 hours:  Temp:  [98 F (36.7 C)-98.1 F (36.7 C)] 98.1 F (36.7 C) (10/27 0829) Pulse Rate:  [72-104] 72 (10/27 0829) Resp:  [12-17] 12 (10/26 2020) BP: (123-158)/(48-67) 158/61 mmHg (10/27 0829) SpO2:  [99 %-100 %] 100 % (10/27 0829) Weight:  [71.759 kg (158 lb 3.2 oz)] 71.759 kg (158 lb 3.2 oz) (10/26 1400)  Weight change:  Filed Weights   10/11/15 1400  Weight: 71.759 kg (158 lb 3.2 oz)    Intake/Output: I/O last 3 completed shifts: In: 164 [I.V.:164] Out: -    Intake/Output this shift:     Physical Exam: General: debiltated  Head: Normocephalic, atraumatic. Moist oral mucosal membranes  Eyes: Eyes open  Neck: Supple, trachea midline  Lungs:  Clear to auscultation normal effort  Heart: S1S2 no rubs 2/6 SEM  Abdomen:  Soft, nontender, BS prseent  Extremities:  trace peripheral edema.  Neurologic: Nonfocal, moving all four extremities  Skin: B/l upper and lower extremity ecchymoses  Access: none    Basic Metabolic Panel:  Recent Labs Lab 10/11/15 1625 10/12/15 0442  NA 127* 131*  K 4.4 4.7  CL 92* 96*  CO2 19* 23  GLUCOSE 72 79  BUN 39* 44*  CREATININE 3.46* 3.46*  CALCIUM 8.0* 8.0*    Liver Function Tests: No results for input(s): AST, ALT, ALKPHOS, BILITOT, PROT, ALBUMIN in the last 168 hours. No results for input(s): LIPASE, AMYLASE in the last 168 hours. No results for input(s): AMMONIA in the last 168 hours.  CBC:  Recent Labs Lab 10/11/15 1625 10/12/15 0442  WBC 3.9 2.3*  HGB 8.8* 8.1*  HCT 25.9* 23.7*  MCV 103.2* 103.7*  PLT 72* 51*    Cardiac Enzymes: No results for input(s): CKTOTAL, CKMB, CKMBINDEX,  TROPONINI in the last 168 hours.  BNP: Invalid input(s): POCBNP  CBG: No results for input(s): GLUCAP in the last 168 hours.  Microbiology: Results for orders placed or performed during the hospital encounter of 08/09/15  Urine culture     Status: None   Collection Time: 08/09/15  2:32 PM  Result Value Ref Range Status   Specimen Description URINE, RANDOM  Final   Special Requests NONE  Final   Culture >=100,000 COLONIES/mL ESCHERICHIA COLI  Final   Report Status 08/13/2015 FINAL  Final   Organism ID, Bacteria ESCHERICHIA COLI  Final      Susceptibility   Escherichia coli - MIC*    AMPICILLIN <=2 SENSITIVE Sensitive     CEFTAZIDIME <=1 SENSITIVE Sensitive     CEFAZOLIN <=4 SENSITIVE Sensitive     CEFTRIAXONE <=1 SENSITIVE Sensitive     CIPROFLOXACIN <=0.25 SENSITIVE Sensitive     GENTAMICIN <=1 SENSITIVE Sensitive     IMIPENEM <=0.25 SENSITIVE Sensitive     TRIMETH/SULFA <=20 SENSITIVE Sensitive     NITROFURANTOIN Value in next row Sensitive      SENSITIVE<=16    PIP/TAZO Value in next row Sensitive      SENSITIVE<=4    LEVOFLOXACIN Value in next row Sensitive      SENSITIVE<=0.12    * >=100,000 COLONIES/mL ESCHERICHIA COLI    Coagulation Studies: No results for  input(s): LABPROT, INR in the last 72 hours.  Urinalysis: No results for input(s): COLORURINE, LABSPEC, PHURINE, GLUCOSEU, HGBUR, BILIRUBINUR, KETONESUR, PROTEINUR, UROBILINOGEN, NITRITE, LEUKOCYTESUR in the last 72 hours.  Invalid input(s): APPERANCEUR    Imaging: No results found.   Medications:   . sodium chloride 20 mL/hr at 10/11/15 2222   . furosemide  20 mg Oral Daily  . [START ON 10/13/2015] Influenza vac split quadrivalent PF  0.5 mL Intramuscular Tomorrow-1000  . metoprolol tartrate  25 mg Oral BID  . multivitamin with minerals  1 tablet Oral Daily  . pantoprazole  40 mg Oral QAC breakfast   acetaminophen **OR** acetaminophen, heparin lock flush, heparin lock flush, ondansetron **OR**  ondansetron (ZOFRAN) IV, sodium chloride, sodium chloride  Assessment/ Plan:  79 y.o. male with alcohol abuse, pancytopenia, hepatic cirrhosis who presents to initiate hemodialysis as an inpatient.  1.  ESRD.  Pt with CrCl of 11 of 09/17/15.  Manifesting uremic symptoms of poor appetite, increasing weakness. -Patient had a lengthy discussion with Dr. Wynelle Link regarding initiation of dialysis.  Will plan to start the patient on hemodialysis here in the hospital.  Permcath to be placed today.  Thereafter will start first dialysis treatement.   2.  Anemia of CKD:  Hgb low at 8.1, will start epogen with HD.    3.  SHPTH of renal origin: check ipth/phos with HD today, further plan once these labs are available.   4.  HTN:  Continue metoprolol for now, tighter control of BP in the past led to orthostatic hypotension.   5.  Will follow closely.    LOS: 1 Mike Booker 10/27/201610:48 AM

## 2015-10-12 NOTE — Progress Notes (Signed)
[facility] - [place] at [facility]   PATIENT NAME: Mike Booker    MR#:  [id number]  DATE OF BIRTH:  23-Apr-1936  SUBJECTIVE:  CHIEF COMPLAINT:  No chief complaint on file.  Sent from nephrology office for starting HD.   S/p permacath and first dialysis today. Seen during his HD session.  REVIEW OF SYSTEMS:  CONSTITUTIONAL: No fever, fatigue or weakness.  EYES: No blurred or double vision.  EARS, NOSE, AND THROAT: No tinnitus or ear pain.  RESPIRATORY: No cough, shortness of breath, wheezing or hemoptysis.  CARDIOVASCULAR: No chest pain, orthopnea, edema.  GASTROINTESTINAL: No nausea, vomiting, diarrhea or abdominal pain.  GENITOURINARY: No dysuria, hematuria.  ENDOCRINE: No polyuria, nocturia,  HEMATOLOGY: No anemia, easy bruising or bleeding SKIN: No rash or lesion. MUSCULOSKELETAL: No joint pain or arthritis.   NEUROLOGIC: No tingling, numbness, Has chronic lower extremity weakness. PSYCHIATRY: No anxiety or depression.   ROS  DRUG ALLERGIES:  No Known Allergies  VITALS:  Blood pressure 152/68, pulse 71, temperature 98.2 F (36.8 C), temperature source Oral, resp. rate 18, height  (1.727 m), weight 71.759 kg (158 lb 3.2 oz), SpO2 100 %.  PHYSICAL EXAMINATION:   GENERAL: 79 y.o.-year-old patient lying in the bed with no acute distress.  EYES: Pupils equal, round, reactive to light and accommodation. No scleral icterus. Extraocular muscles intact.  HEENT: Head atraumatic, normocephalic. Oropharynx and nasopharynx clear.  NECK: Supple, no jugular venous distention. No thyroid enlargement, no tenderness.  LUNGS: Normal breath sounds bilaterally, no wheezing, rales,rhonchi or crepitation. No use of accessory muscles of respiration.  CARDIOVASCULAR: S1, S2 normal. No murmurs, rubs, or gallops.  ABDOMEN: Soft, nontender, nondistended. Bowel sounds present. No organomegaly or mass.  EXTREMITIES: No pedal edema, cyanosis, or clubbing.   NEUROLOGIC: Cranial nerves II through XII are intact. Muscle strength 5/5 in upper limbs , but 2-3/5 in lower extremities. Sensation intact. Gait not checked.  PSYCHIATRIC: The patient is alert and oriented x 3.  SKIN: No obvious rash, lesion, or ulcer.   Physical Exam LABORATORY PANEL:   CBC  Recent Labs Lab 10/12/15 0442  WBC 2.3*  HGB 8.1*  HCT 23.7*  PLT 51*   ------------------------------------------------------------------------------------------------------------------  Chemistries   Recent Labs Lab 10/12/15 0442  NA 131*  K 4.7  CL 96*  CO2 23  GLUCOSE 79  BUN 44*  CREATININE 3.46*  CALCIUM 8.0*   ------------------------------------------------------------------------------------------------------------------  Cardiac Enzymes No results for input(s): TROPONINI in the last 168 hours. ------------------------------------------------------------------------------------------------------------------  RADIOLOGY:  No results found.  ASSESSMENT AND PLAN:   Active Problems:   Chronic kidney disease (CKD), stage IV (severe) (HCC)   Renal failure   Protein-calorie malnutrition, severe   29. 79 year old male patient with worsening renal failure: Need for hemodialysis access  Appreciated nephro and vascular help- s/p permacath and started on HD.  #2 alcoholic liver cirrhosis with thrombocytopenia; continue to drink, discussed about CESSATION and counseled him for 4 minutes. Continue Lasix, metoprolol.  #3 generalized weakness unable to walk , bedridden, uses wheelchair occasionally: get physical therapy evaluation.   All the records are reviewed and case discussed with Care Management/Social Workerr. Management plans discussed with the patient, family and they are in agreement.  CODE STATUS:full  TOTAL TIME TAKING CARE OF THIS PATIENT: 35 minutes.     POSSIBLE D/C IN 2-3 DAYS, DEPENDING ON CLINICAL CONDITION.   Altamese Dilling M.D on  10/12/2015   Between 7am to 6pm - Pager - [phone]  After 6pm go  to [email or web address] - password [email or web address] [email or web address]  [facility]  Office  [phone]  CC: Primary care physician; Lamont DowdyKOLLURU, SARATH, MD  Note: This dictation was prepared with Dragon dictation along with smaller phrase technology. Any transcriptional errors that result from this process are unintentional.

## 2015-10-13 ENCOUNTER — Encounter: Payer: Self-pay | Admitting: Vascular Surgery

## 2015-10-13 DIAGNOSIS — I120 Hypertensive chronic kidney disease with stage 5 chronic kidney disease or end stage renal disease: Secondary | ICD-10-CM | POA: Diagnosis not present

## 2015-10-13 LAB — PHOSPHORUS: Phosphorus: 3.1 mg/dL (ref 2.5–4.6)

## 2015-10-13 MED ORDER — EPOETIN ALFA 10000 UNIT/ML IJ SOLN: 10000.0000 [IU] | INTRAMUSCULAR | Status: DC

## 2015-10-13 NOTE — Progress Notes (Signed)
[facility] - [place] at [facility]   PATIENT NAME: Mike Booker    MR#:  [id number]  DATE OF BIRTH:  03/03/1936  SUBJECTIVE:  CHIEF COMPLAINT:  No chief complaint on file.  Sent from nephrology office for starting HD.   S/p permacath and first dialysis yesterday. Scheduled for HD again today. No SOB/CP/Abd pain.  REVIEW OF SYSTEMS:  CONSTITUTIONAL: No fever, fatigue or weakness.  EYES: No blurred or double vision.  EARS, NOSE, AND THROAT: No tinnitus or ear pain.  RESPIRATORY: No cough, shortness of breath, wheezing or hemoptysis.  CARDIOVASCULAR: No chest pain, orthopnea, edema.  GASTROINTESTINAL: No nausea, vomiting, diarrhea or abdominal pain.  GENITOURINARY: No dysuria, hematuria.  ENDOCRINE: No polyuria, nocturia,  HEMATOLOGY: No anemia, easy bruising or bleeding SKIN: No rash or lesion. Bruising arms MUSCULOSKELETAL: No joint pain or arthritis.   NEUROLOGIC: No tingling, numbness, Has chronic lower extremity weakness. PSYCHIATRY: No anxiety or depression.   ROS  DRUG ALLERGIES:  No Known Allergies  VITALS:  Blood pressure 137/74, pulse 88, temperature 98.6 F (37 C), temperature source Oral, resp. rate 20, height 5\' 8"  (1.727 m), weight 71.3 kg (157 lb 3 oz), SpO2 99 %.  PHYSICAL EXAMINATION:   GENERAL: 79 y.o.-year-old patient lying in the bed with no acute distress.  EYES: Pupils equal, round, reactive to light and accommodation. No scleral icterus. Extraocular muscles intact.  HEENT: Head atraumatic, normocephalic. Oropharynx and nasopharynx clear.  NECK: Supple, no jugular venous distention. No thyroid enlargement, no tenderness.  LUNGS: Normal breath sounds bilaterally, no wheezing, rales,rhonchi or crepitation. No use of accessory muscles of respiration.  CARDIOVASCULAR: S1, S2 normal. No murmurs, rubs, or gallops.  ABDOMEN: Soft, nontender, nondistended. Bowel sounds present. No organomegaly or mass.  EXTREMITIES: No  pedal edema, cyanosis, or clubbing.  NEUROLOGIC: Cranial nerves II through XII are intact. Muscle strength 5/5 in upper limbs , but 2-3/5 in lower extremities. Sensation intact. Gait not checked.  PSYCHIATRIC: The patient is alert and oriented x 3.  SKIN: No obvious rash, lesion, or ulcer. Bruising arms.  Tunneled HD catheter.  Physical Exam LABORATORY PANEL:   CBC  Recent Labs Lab 10/12/15 0442  WBC 2.3*  HGB 8.1*  HCT 23.7*  PLT 51*   ------------------------------------------------------------------------------------------------------------------  Chemistries   Recent Labs Lab 10/12/15 0442  NA 131*  K 4.7  CL 96*  CO2 23  GLUCOSE 79  BUN 44*  CREATININE 3.46*  CALCIUM 8.0*   ------------------------------------------------------------------------------------------------------------------  Cardiac Enzymes No results for input(s): TROPONINI in the last 168 hours. ------------------------------------------------------------------------------------------------------------------  RADIOLOGY:  No results found.  ASSESSMENT AND PLAN:   Active Problems:   Chronic kidney disease (CKD), stage IV (severe) (HCC)   Renal failure   Protein-calorie malnutrition, severe   # 79 year old male patient with worsening renal failure Appreciated nephro and vascular help- s/p permacath and started on HD. CM working on OP HD setup.  # Alcoholic liver cirrhosis with thrombocytopenia; continues to drink Counseled to quit  # Generalized weakness unable to walk , bedridden, uses wheelchair occasionally PT evalulation ordered  # Hypertension On Lopressor. Monitor  All the records are reviewed and case discussed with Care Management/Social Workerr. Management plans discussed with the patient, family and they are in agreement.  CODE STATUS:full  TOTAL TIME TAKING CARE OF THIS PATIENT: 35 minutes.   POSSIBLE D/C IN 2-3 DAYS, DEPENDING ON WHEN OP HD IS  SETUP.   Milagros LollSudini, Tamyra Fojtik R M.D on 10/13/2015   Between 7am to 6pm -  Pager - [phone]  After 6pm go to [email or web address] - password [email or web address] [facility]  [facility] [place] [facility]  Office  [phone]  CC: Primary care physician; Lamont Dowdy, MD  Note: This dictation was prepared with Dragon dictation along with smaller phrase technology. Any transcriptional errors that result from this process are unintentional.

## 2015-10-13 NOTE — Progress Notes (Signed)
[facility]  Daily Progress Note   Subjective  - 1 Day Post-Op  Doing well after permcath yesterday Permcath worked well for dialysis Will need permanent access, and doubt he will have adequate veins for AVF so likely will be AVG  Objective Filed Vitals:   10/12/15 1843 10/12/15 2140 10/13/15 0540 10/13/15 0909  BP: 166/68 142/62 152/72 137/74  Pulse: 78 71 74 88  Temp: 98.5 F (36.9 C) 98.7 F (37.1 C) 98.4 F (36.9 C) 98.6 F (37 C)  TempSrc: Oral Oral Oral Oral  Resp: 19 18 18 20   Height:      Weight:      SpO2: 98% 99% 98% 99%    Intake/Output Summary (Last 24 hours) at 10/13/15 0912 Last data filed at 10/13/15 0500  Gross per 24 hour  Intake      0 ml  Output   -150 ml  Net    150 ml    PULM  CTAB CV  RRR VASC  Permcath is intact, mild bruising, no hematoma or bleeding  Laboratory CBC    Component Value Date/Time   WBC 2.3* 10/12/2015 0442   WBC 3.9 01/17/2014 1431   HGB 8.1* 10/12/2015 0442   HGB 9.0* 01/17/2014 1431   HCT 23.7* 10/12/2015 0442   HCT 26.8* 01/17/2014 1431   PLT 51* 10/12/2015 0442   PLT 101* 01/17/2014 1431    BMET    Component Value Date/Time   NA 131* 10/12/2015 0442   NA 139 12/21/2013 1412   K 4.7 10/12/2015 0442   K 3.9 12/21/2013 1412   CL 96* 10/12/2015 0442   CL 100 12/21/2013 1412   CO2 23 10/12/2015 0442   CO2 26 12/21/2013 1412   GLUCOSE 79 10/12/2015 0442   GLUCOSE 131* 12/21/2013 1412   BUN 44* 10/12/2015 0442   BUN 62* 12/21/2013 1412   CREATININE 3.46* 10/12/2015 0442   CREATININE 4.59* 12/21/2013 1412   CALCIUM 8.0* 10/12/2015 0442   CALCIUM 9.6 12/21/2013 1412   GFRNONAA 16* 10/12/2015 0442   GFRNONAA 12* 12/21/2013 1412   GFRAA 18* 10/12/2015 0442   GFRAA 13* 12/21/2013 1412    Assessment/Planning: POD #1 s/p permcath for ESRD   Will need permanent access and will plan AVG early next week, likely on Tuesday  Will use Permcath for several weeks while AVG/AVF  heals    Mike Booker  10/13/2015, 9:12 AM

## 2015-10-13 NOTE — Progress Notes (Signed)
HD tx completed.

## 2015-10-13 NOTE — Progress Notes (Signed)
Post hd tx 

## 2015-10-13 NOTE — Progress Notes (Signed)
Pre-hd tx 

## 2015-10-13 NOTE — Progress Notes (Signed)
[facility]  ROUNDING NOTE   Subjective:  Pt completed 1st HD treatment yesterday. Tolerated well. Discussed case with Dr. Wyn Quaker.    Objective:  Vital signs in last 24 hours:  Temp:  [98 F (36.7 C)-98.7 F (37.1 C)] 98.6 F (37 C) (10/28 0909) Pulse Rate:  [62-88] 88 (10/28 0909) Resp:  [11-20] 20 (10/28 0909) BP: (137-176)/(56-88) 137/74 mmHg (10/28 0909) SpO2:  [98 %-100 %] 99 % (10/28 0909) Weight:  [71.3 kg (157 lb 3 oz)] 71.3 kg (157 lb 3 oz) (10/27 1839)  Weight change: -0.459 kg (-1 lb 0.2 oz) Filed Weights   10/11/15 1400 10/12/15 1839  Weight: 71.759 kg (158 lb 3.2 oz) 71.3 kg (157 lb 3 oz)    Intake/Output: I/O last 3 completed shifts: In: 164 [I.V.:164] Out: -150 [Urine:350]   Intake/Output this shift:  Total I/O In: 240 [P.O.:240] Out: -   Physical Exam: General: NAD, resting comfortably  Head: Normocephalic, atraumatic. Moist oral mucosal membranes  Eyes: Anicteric  Neck: Supple, trachea midline  Lungs:  Clear to auscultation normal effort  Heart: Regular rate and rhythm  Abdomen:  Soft, nontender, BS present  Extremities:  trace peripheral edema.  Neurologic: Nonfocal, moving all four extremities  Skin: Ecchymoses b/l UE's  Access: R IJ permcath    Basic Metabolic Panel:  Recent Labs Lab 10/11/15 1625 10/12/15 0442  NA 127* 131*  K 4.4 4.7  CL 92* 96*  CO2 19* 23  GLUCOSE 72 79  BUN 39* 44*  CREATININE 3.46* 3.46*  CALCIUM 8.0* 8.0*    Liver Function Tests: No results for input(s): AST, ALT, ALKPHOS, BILITOT, PROT, ALBUMIN in the last 168 hours. No results for input(s): LIPASE, AMYLASE in the last 168 hours. No results for input(s): AMMONIA in the last 168 hours.  CBC:  Recent Labs Lab 10/11/15 1625 10/12/15 0442  WBC 3.9 2.3*  HGB 8.8* 8.1*  HCT 25.9* 23.7*  MCV 103.2* 103.7*  PLT 72* 51*    Cardiac Enzymes: No results for input(s): CKTOTAL, CKMB, CKMBINDEX, TROPONINI in the last 168  hours.  BNP: Invalid input(s): POCBNP  CBG: No results for input(s): GLUCAP in the last 168 hours.  Microbiology: Results for orders placed or performed during the hospital encounter of 08/09/15  Urine culture     Status: None   Collection Time: 08/09/15  2:32 PM  Result Value Ref Range Status   Specimen Description URINE, RANDOM  Final   Special Requests NONE  Final   Culture >=100,000 COLONIES/mL ESCHERICHIA COLI  Final   Report Status 08/13/2015 FINAL  Final   Organism ID, Bacteria ESCHERICHIA COLI  Final      Susceptibility   Escherichia coli - MIC*    AMPICILLIN <=2 SENSITIVE Sensitive     CEFTAZIDIME <=1 SENSITIVE Sensitive     CEFAZOLIN <=4 SENSITIVE Sensitive     CEFTRIAXONE <=1 SENSITIVE Sensitive     CIPROFLOXACIN <=0.25 SENSITIVE Sensitive     GENTAMICIN <=1 SENSITIVE Sensitive     IMIPENEM <=0.25 SENSITIVE Sensitive     TRIMETH/SULFA <=20 SENSITIVE Sensitive     NITROFURANTOIN Value in next row Sensitive      SENSITIVE<=16    PIP/TAZO Value in next row Sensitive      SENSITIVE<=4    LEVOFLOXACIN Value in next row Sensitive      SENSITIVE<=0.12    * >=100,000 COLONIES/mL ESCHERICHIA COLI    Coagulation Studies: No results for input(s): LABPROT, INR in the last 72 hours.  Urinalysis:  No results for input(s): COLORURINE, LABSPEC, PHURINE, GLUCOSEU, HGBUR, BILIRUBINUR, KETONESUR, PROTEINUR, UROBILINOGEN, NITRITE, LEUKOCYTESUR in the last 72 hours.  Invalid input(s): APPERANCEUR    Imaging: No results found.   Medications:     . furosemide  20 mg Oral Daily  . metoprolol tartrate  25 mg Oral BID  . multivitamin with minerals  1 tablet Oral Daily  . pantoprazole  40 mg Oral QAC breakfast  . tuberculin  5 Units Intradermal Once   sodium chloride, sodium chloride, acetaminophen **OR** acetaminophen, alteplase, heparin, heparin lock flush, heparin lock flush, lidocaine (PF), lidocaine-prilocaine, ondansetron **OR** ondansetron (ZOFRAN) IV,  pentafluoroprop-tetrafluoroeth, sodium chloride, sodium chloride  Assessment/ Plan:  79 y.o. male with alcohol abuse, pancytopenia, hepatic cirrhosis who presents to initiate hemodialysis as an inpatient.  1. ESRD. Pt with CrCl of 11 of 09/17/15. Manifesting uremic symptoms of poor appetite, increasing weakness. -Pt due for second dialysis treatment today, orders prepared, next HD tomorrow.   2. Anemia of CKD: Initiate epogen 10000 units IV MWF.  3. SHPTH of renal origin:  No need for binders at present.  4. HTN: BP 137/74, continue metoprolol.   LOS: 2 Norell Brisbin 10/28/20163:09 PM

## 2015-10-13 NOTE — Care Management Note (Addendum)
I am starting outpatient dialysis placement today for this patient. He has been followed by Dr. Wynelle LinkKolluru as a outpatient in his office. [facility] [address]  Ivor ReiningKim Payson Crumby Dialysis Liaison  [phone]  Addendum Note  [facility] [address]  Patient medical records are still missing chest xray or PPD and pending Hep B labs.  I will forward these once they become available in EPIC.

## 2015-10-13 NOTE — Progress Notes (Signed)
HD tx start 

## 2015-10-13 NOTE — Care Management Note (Signed)
Case Management Note  Patient Details  Name: Mike FarrHerman Booker MRN: [id number] Date of Birth: 04/12/1936  Subjective/Objective:    Case discussed with Ivor ReiningKim Riddle, dialysis coordinator.  Awaiting dialysis schedule.                Action/Plan:   Expected Discharge Date:                  Expected Discharge Plan:     In-House Referral:     Discharge planning Services     Post Acute Care Choice:    Choice offered to:     DME Arranged:    DME Agency:     HH Arranged:    HH Agency:     Status of Service:     Medicare Important Message Given:  Yes-second notification given Date Medicare IM Given:    Medicare IM give by:    Date Additional Medicare IM Given:    Additional Medicare Important Message give by:     If discussed at Long Length of Stay Meetings, dates discussed:    Additional Comments:  Marily MemosLisa M Theresea Trautmann, RN 10/13/2015, 3:21 PM

## 2015-10-13 NOTE — Care Management Important Message (Signed)
Important Message  Patient Details  Name: Thana FarrHerman Farnam MRN: [id number] Date of Birth: 08/13/1936   Medicare Important Message Given:  Yes-second notification given    Olegario MessierKathy A Irven Ingalsbe 10/13/2015, 10:34 AM

## 2015-10-14 LAB — RENAL FUNCTION PANEL
ALBUMIN: 2.3 g/dL — AB (ref 3.5–5.0)
ANION GAP: 6 (ref 5–15)
BUN: 14 mg/dL (ref 6–20)
CO2: 30 mmol/L (ref 22–32)
Calcium: 7.9 mg/dL — ABNORMAL LOW (ref 8.9–10.3)
Chloride: 95 mmol/L — ABNORMAL LOW (ref 101–111)
Creatinine, Ser: 2.06 mg/dL — ABNORMAL HIGH (ref 0.61–1.24)
GFR, EST AFRICAN AMERICAN: 34 mL/min — AB (ref >60)
GFR, EST NON AFRICAN AMERICAN: 29 mL/min — AB (ref >60)
Glucose, Bld: 126 mg/dL — ABNORMAL HIGH (ref 65–99)
PHOSPHORUS: 1.7 mg/dL — AB (ref 2.5–4.6)
POTASSIUM: 3.5 mmol/L (ref 3.5–5.1)
Sodium: 131 mmol/L — ABNORMAL LOW (ref 135–145)

## 2015-10-14 LAB — CBC
HEMATOCRIT: 24.7 % — AB (ref 40.0–52.0)
Hemoglobin: 8.4 g/dL — ABNORMAL LOW (ref 13.0–18.0)
MCH: 35.5 pg — ABNORMAL HIGH (ref 26.0–34.0)
MCHC: 34 g/dL (ref 32.0–36.0)
MCV: 104.3 fL — ABNORMAL HIGH (ref 80.0–100.0)
Platelets: 44 10*3/uL — ABNORMAL LOW (ref 150–440)
RBC: 2.37 MIL/uL — AB (ref 4.40–5.90)
RDW: 15.1 % — AB (ref 11.5–14.5)
WBC: 3.4 10*3/uL — AB (ref 3.8–10.6)

## 2015-10-14 LAB — PARATHYROID HORMONE, INTACT (NO CA): PTH: 50 pg/mL (ref 15–65)

## 2015-10-14 LAB — HEPATITIS B SURFACE ANTIGEN: Hepatitis B Surface Ag: NEGATIVE

## 2015-10-14 LAB — HEPATITIS B CORE ANTIBODY, TOTAL: HEP B C TOTAL AB: NEGATIVE

## 2015-10-14 LAB — HEPATITIS B SURFACE ANTIBODY,QUALITATIVE: HEP B S AB: NONREACTIVE

## 2015-10-14 NOTE — Progress Notes (Signed)
[facility]  ROUNDING NOTE   Subjective:   Patient laying in bed. Found to have scotch whisky at bedside.  Patient Completed two treatment of hemodialysis.   Objective:  Vital signs in last 24 hours:  Temp:  [98.4 F (36.9 C)-99.9 F (37.7 C)] 99.9 F (37.7 C) (10/29 0451) Pulse Rate:  [67-95] 95 (10/29 0451) Resp:  [14-22] 22 (10/29 0451) BP: (130-177)/(64-84) 130/69 mmHg (10/29 0451) SpO2:  [96 %-100 %] 96 % (10/29 0451) Weight:  [72 kg (158 lb 11.7 oz)] 72 kg (158 lb 11.7 oz) (10/28 1813)  Weight change: 0.7 kg (1 lb 8.7 oz) Filed Weights   10/12/15 1839 10/13/15 1515 10/13/15 1813  Weight: 71.3 kg (157 lb 3 oz) 72 kg (158 lb 11.7 oz) 72 kg (158 lb 11.7 oz)    Intake/Output: I/O last 3 completed shifts: In: 240 [P.O.:240] Out: 480 [Urine:480]   Intake/Output this shift:  Total I/O In: 120 [P.O.:120] Out: 50 [Urine:50]  Physical Exam: General: NAD, resting comfortably  Head: Normocephalic, atraumatic. Moist oral mucosal membranes  Eyes: Anicteric  Neck: Supple, trachea midline  Lungs:  Clear to auscultation normal effort  Heart: Regular rate and rhythm  Abdomen:  Soft, nontender, BS present  Extremities:  trace peripheral edema.  Neurologic: Nonfocal, moving all four extremities  Skin: Ecchymoses b/l UE's  Access: R IJ permcath Dr. Wyn Quaker 10/12/15    Basic Metabolic Panel:  Recent Labs Lab 10/11/15 1625 10/12/15 0442 10/13/15 1545  NA 127* 131*  --   K 4.4 4.7  --   CL 92* 96*  --   CO2 19* 23  --   GLUCOSE 72 79  --   BUN 39* 44*  --   CREATININE 3.46* 3.46*  --   CALCIUM 8.0* 8.0*  --   PHOS  --   --  3.1    Liver Function Tests: No results for input(s): AST, ALT, ALKPHOS, BILITOT, PROT, ALBUMIN in the last 168 hours. No results for input(s): LIPASE, AMYLASE in the last 168 hours. No results for input(s): AMMONIA in the last 168 hours.  CBC:  Recent Labs Lab 10/11/15 1625 10/12/15 0442  WBC 3.9 2.3*  HGB 8.8* 8.1*  HCT  25.9* 23.7*  MCV 103.2* 103.7*  PLT 72* 51*    Cardiac Enzymes: No results for input(s): CKTOTAL, CKMB, CKMBINDEX, TROPONINI in the last 168 hours.  BNP: Invalid input(s): POCBNP  CBG: No results for input(s): GLUCAP in the last 168 hours.  Microbiology: Results for orders placed or performed during the hospital encounter of 08/09/15  Urine culture     Status: None   Collection Time: 08/09/15  2:32 PM  Result Value Ref Range Status   Specimen Description URINE, RANDOM  Final   Special Requests NONE  Final   Culture >=100,000 COLONIES/mL ESCHERICHIA COLI  Final   Report Status 08/13/2015 FINAL  Final   Organism ID, Bacteria ESCHERICHIA COLI  Final      Susceptibility   Escherichia coli - MIC*    AMPICILLIN <=2 SENSITIVE Sensitive     CEFTAZIDIME <=1 SENSITIVE Sensitive     CEFAZOLIN <=4 SENSITIVE Sensitive     CEFTRIAXONE <=1 SENSITIVE Sensitive     CIPROFLOXACIN <=0.25 SENSITIVE Sensitive     GENTAMICIN <=1 SENSITIVE Sensitive     IMIPENEM <=0.25 SENSITIVE Sensitive     TRIMETH/SULFA <=20 SENSITIVE Sensitive     NITROFURANTOIN Value in next row Sensitive      SENSITIVE<=16    PIP/TAZO Value  in next row Sensitive      SENSITIVE<=4    LEVOFLOXACIN Value in next row Sensitive      SENSITIVE<=0.12    * >=100,000 COLONIES/mL ESCHERICHIA COLI    Coagulation Studies: No results for input(s): LABPROT, INR in the last 72 hours.  Urinalysis: No results for input(s): COLORURINE, LABSPEC, PHURINE, GLUCOSEU, HGBUR, BILIRUBINUR, KETONESUR, PROTEINUR, UROBILINOGEN, NITRITE, LEUKOCYTESUR in the last 72 hours.  Invalid input(s): APPERANCEUR    Imaging: No results found.   Medications:     . [START ON 10/16/2015] epoetin (EPOGEN/PROCRIT) injection  10,000 Units Intravenous Q M,W,F-HD  . furosemide  20 mg Oral Daily  . metoprolol tartrate  25 mg Oral BID  . multivitamin with minerals  1 tablet Oral Daily  . pantoprazole  40 mg Oral QAC breakfast  . tuberculin  5 Units  Intradermal Once   sodium chloride, sodium chloride, acetaminophen **OR** acetaminophen, alteplase, heparin, heparin lock flush, heparin lock flush, lidocaine (PF), lidocaine-prilocaine, ondansetron **OR** ondansetron (ZOFRAN) IV, pentafluoroprop-tetrafluoroeth, sodium chloride, sodium chloride  Assessment/ Plan:  79 y.o. white male with alcohol abuse, pancytopenia, hepatic cirrhosis who presents to initiate hemodialysis as an inpatient.  1. ESRD. Pt with CrCl of 11 of 09/17/15. Manifesting uremic symptoms of poor appetite, increasing weakness. - hemodialysis for later today. Third treatment. Patient will need outpatient planning.  Patient is in no shape to be discharged. He is not ambulating and he is very weak.  - Consult physical therapy.   2. Anemia of CKD: hemoglobin 8.1 - epo with hemodialysis   3. SHPTH of renal origin:  Phos 3.1. PTH low at 50.  - No need for binders at present.  4. HTN: well controlled.  - continue metoprolol.   LOS: 3 Mahli Glahn 10/29/201612:50 PM

## 2015-10-14 NOTE — Progress Notes (Signed)
MD reported pt had a bottle of scotch whiskey in his room. Pt admitted to RN he had a bottle of scotch in his bag and he always drink two glasses a night and he didn't know alcohol was not allowed as he was able to drink in the nursing home. Explained dangers of drinking and what safety issue of drinking in the hospital. Pt wife took bottle home

## 2015-10-14 NOTE — Progress Notes (Signed)
[facility] [place] [facility]   PATIENT NAME: Mike FarrHerman Booker    MR#:  [id number]  DATE OF BIRTH:  11/29/1936  SUBJECTIVE:  Patient is reporting that he is getting very anxious and wants to leave the hospital.  REVIEW OF SYSTEMS:   Review of Systems  Constitutional: Negative for fever, chills and malaise/fatigue.  HENT: Negative for sore throat.   Eyes: Negative for blurred vision.  Respiratory: Negative for cough, hemoptysis, shortness of breath and wheezing.   Cardiovascular: Negative for chest pain, palpitations and leg swelling.  Gastrointestinal: Negative for nausea, vomiting, abdominal pain, diarrhea and blood in stool.  Genitourinary: Negative for dysuria.  Musculoskeletal: Negative for back pain.  Neurological: Negative for dizziness, tremors and headaches.  Endo/Heme/Allergies: Does not bruise/bleed easily.    DRUG ALLERGIES:  No Known Allergies  VITALS:  Blood pressure 130/69, pulse 95, temperature 99.9 F (37.7 C), temperature source Oral, resp. rate 22, height 5\' 8"  (1.727 m), weight 72 kg (158 lb 11.7 oz), SpO2 96 %.  PHYSICAL EXAMINATION:   GENERAL: 79 y.o.-year-old patient lying in the bed with no acute distress.  EYES: Pupils equal, round, reactive to light and accommodation. No scleral icterus. Extraocular muscles intact.  HEENT: Head atraumatic, normocephalic. Oropharynx and nasopharynx clear.  NECK: Supple, no jugular venous distention. No thyroid enlargement, no tenderness.  LUNGS: Normal breath sounds bilaterally, no wheezing, rales,rhonchi or crepitation. No use of accessory muscles of respiration.  CARDIOVASCULAR: S1, S2 normal. No murmurs, rubs, or gallops.  ABDOMEN: Soft, nontender, nondistended. Bowel sounds present. No organomegaly or mass.  EXTREMITIES: No pedal edema, cyanosis, or clubbing.  NEUROLOGIC: Cranial nerves II through XII are intact. Muscle strength 5/5 in upper limbs , but 2-3/5 in lower  extremities. Sensation intact. Gait not checked.  PSYCHIATRIC: The patient is alert and oriented x 3.  SKIN: No obvious rash, lesion, or ulcer. Bruising arms.  Tunneled HD catheter.  Physical Exam LABORATORY PANEL:   CBC  Recent Labs Lab 10/12/15 0442  WBC 2.3*  HGB 8.1*  HCT 23.7*  PLT 51*   ------------------------------------------------------------------------------------------------------------------  Chemistries   Recent Labs Lab 10/12/15 0442  NA 131*  K 4.7  CL 96*  CO2 23  GLUCOSE 79  BUN 44*  CREATININE 3.46*  CALCIUM 8.0*   ------------------------------------------------------------------------------------------------------------------  Cardiac Enzymes No results for input(s): TROPONINI in the last 168 hours. ------------------------------------------------------------------------------------------------------------------  RADIOLOGY:  No results found.  ASSESSMENT AND PLAN:   Active Problems:   Chronic kidney disease (CKD), stage IV (severe) (HCC)   Renal failure   Protein-calorie malnutrition, severe    79 year old male patient with worsening renal failure  1. End-stage renal disease: Patient now has tunneled dialysis catheter. Today will be session 3 for dialysis. Patient is case management to assist with discharge planning for outpatient doses.   2. Alcoholic liver cirrhosis with thrombocytopenia; continues to drink Counseled to quit  3.  Generalized weakness unable to walk , bedridden, uses wheelchair occasionally  PT consult ordered  4.Essential Hypertension On Lopressor.    Management plans discussed with the patient and he is in agreement.   CODE STATUS:full  TOTAL TIME TAKING CARE OF THIS PATIENT: 25 minutes.   POSSIBLE D/C IN 2-3 DAYS, DEPENDING ON WHEN OP HD IS SETUP.   Mike Booker M.D on 10/14/2015   Between 7am to 6pm - Pager - [phone]  After 6pm go to [email or web address] - password [email or web address] [facility]  [facility]  Office  [phone]  CC: Primary  care physician; Mike Dowdy, MD  Note: This dictation was prepared with Dragon dictation along with smaller phrase technology. Any transcriptional errors that result from this process are unintentional.

## 2015-10-14 NOTE — Progress Notes (Signed)
Nutrition Education Note  RD consulted for Renal Education. Provided written handout on dietary guidelines for adults starting on dialysis. Provided verbal education to pt and his wife; wife does the grocery shopping and cooking.   Explained why diet restrictions are needed and provided lists of foods to limit/avoid that are high potassium, sodium, and phosphorus. Provided specific recommendations on safer alternatives of these foods. Strongly encouraged compliance of this diet.   Discussed importance of protein intake at each meal and snack. Provided examples of how to maximize protein intake throughout the day. Discussed need for fluid restriction with dialysis, importance of minimizing weight gain between HD treatments, and renal-friendly beverage options.  Encouraged pt to discuss specific diet questions/concerns with RD at HD outpatient facility. Teach back method used.  Expect fair compliance.  Mike Booker Draden Cottingham MS, RD, LDN [phone] Pager

## 2015-10-14 NOTE — Progress Notes (Signed)
PT Cancellation Note  Patient Details Name: Thana FarrHerman Romito MRN: [id number] DOB: 10/29/1936   Cancelled Treatment:    Reason Eval/Treat Not Completed: Medical issues which prohibited therapy. After reviewing chart, it was determined that patient's hematocrit levels have been trending lower since admission and are currently at a contraindicated level. PT will check back later when medically appropriate to treat.   Neita CarpJulie Ann Calieb Lichtman, PT, DPT 10/14/2015, 3:21 PM

## 2015-10-15 LAB — CBC
HEMATOCRIT: 26.3 % — AB (ref 40.0–52.0)
HEMOGLOBIN: 8.7 g/dL — AB (ref 13.0–18.0)
MCH: 34.8 pg — ABNORMAL HIGH (ref 26.0–34.0)
MCHC: 33.1 g/dL (ref 32.0–36.0)
MCV: 105.1 fL — ABNORMAL HIGH (ref 80.0–100.0)
Platelets: 52 10*3/uL — ABNORMAL LOW (ref 150–440)
RBC: 2.51 MIL/uL — AB (ref 4.40–5.90)
RDW: 15.2 % — ABNORMAL HIGH (ref 11.5–14.5)
WBC: 2.8 10*3/uL — AB (ref 3.8–10.6)

## 2015-10-15 NOTE — Progress Notes (Signed)
[facility] [place] [facility]   PATIENT NAME: Mike Booker    MR#:  [id number]  DATE OF BIRTH:  September 18, 1936  SUBJECTIVE:  Patient reports that he is going home today.  REVIEW OF SYSTEMS:   Review of Systems  Constitutional: Negative for fever, chills and malaise/fatigue.  HENT: Negative for sore throat.   Eyes: Negative for blurred vision.  Respiratory: Negative for cough, hemoptysis, shortness of breath and wheezing.   Cardiovascular: Negative for chest pain, palpitations and leg swelling.  Gastrointestinal: Negative for nausea, vomiting, abdominal pain, diarrhea and blood in stool.  Genitourinary: Negative for dysuria.  Musculoskeletal: Negative for back pain.  Neurological: Negative for dizziness, tremors and headaches.  Endo/Heme/Allergies: Does not bruise/bleed easily.  Psychiatric/Behavioral: Positive for depression and substance abuse. Negative for suicidal ideas. The patient is nervous/anxious.     DRUG ALLERGIES:  No Known Allergies  VITALS:  Blood pressure 163/72, pulse 77, temperature 98.4 F (36.9 C), temperature source Oral, resp. rate 20, height  (1.727 m), weight 73 kg (160 lb 15 oz), SpO2 100 %.  PHYSICAL EXAMINATION:     Tunneled HD catheter.  Physical Exam  Constitutional: He is oriented to person, place, and time and well-developed, well-nourished, and in no distress. No distress.  HENT:  Head: Normocephalic.  Eyes: No scleral icterus.  Neck: Normal range of motion. Neck supple. No JVD present. No tracheal deviation present.  Cardiovascular: Normal rate, regular rhythm and normal heart sounds.  Exam reveals no gallop and no friction rub.   No murmur heard. Pulmonary/Chest: Effort normal and breath sounds normal. No respiratory distress. He has no wheezes. He has no rales. He exhibits no tenderness.  Abdominal: Soft. Bowel sounds are normal. He exhibits no distension and no mass. There is no tenderness. There is no  rebound and no guarding.  Musculoskeletal: Normal range of motion. He exhibits no edema.  Neurological: He is alert and oriented to person, place, and time.  Skin: Skin is warm. No rash noted. No erythema.  bruising   Psychiatric: Affect normal.   LABORATORY PANEL:   CBC  Recent Labs Lab 10/14/15 1559  WBC 3.4*  HGB 8.4*  HCT 24.7*  PLT 44*   ------------------------------------------------------------------------------------------------------------------  Chemistries   Recent Labs Lab 10/14/15 1559  NA 131*  K 3.5  CL 95*  CO2 30  GLUCOSE 126*  BUN 14  CREATININE 2.06*  CALCIUM 7.9*   ------------------------------------------------------------------------------------------------------------------  Cardiac Enzymes No results for input(s): TROPONINI in the last 168 hours. ------------------------------------------------------------------------------------------------------------------  RADIOLOGY:  No results found.  ASSESSMENT AND PLAN:   Active Problems:   Chronic kidney disease (CKD), stage IV (severe) (HCC)   Renal failure   Protein-calorie malnutrition, severe    79 year old male patient with worsening renal failure  1. End-stage renal disease: Patient now has tunneled dialysis catheter. He has had 3 dialysis sessions and we are now waiting for outpatient placement for dialysis. Patient was made aware he is not able to leave the hospital until we have outpatient dialysis set up.  2. Alcoholic liver cirrhosis with thrombocytopenia; continues to drink  3.  Generalized weakness: PT consult ordered  4.Essential Hypertension On Lopressor.    Management plans discussed with the patient and he  understands but does not fully agree about his discharge Discussed with Dr.Kolloru  CODE STATUS:full  TOTAL TIME TAKING CARE OF THIS PATIENT: 20 minutes.   POSSIBLE D/C IN 2-3 DAYS, DEPENDING ON WHEN OP HD IS SETUP.   Mike Booker,  Mike Booker M.D on 10/15/2015    Between 7am to 6pm - Pager - [phone]  After 6pm go to [email or web address] - password [email or web address] [facility]  [facility]  Office  [phone]  CC: Primary care physician; Lamont DowdyKOLLURU, SARATH, MD  Note: This dictation was prepared with Dragon dictation along with smaller phrase technology. Any transcriptional errors that result from this process are unintentional.

## 2015-10-15 NOTE — Progress Notes (Signed)
Dr. Wynelle LinkKolluru was contacted regarding patient's diet per patient's request.  An order for a regular diet was given over the telephone.

## 2015-10-15 NOTE — Progress Notes (Signed)
Physical Therapy Evaluation Patient Details Name: Mike Booker MRN: [id number] DOB: 1936/01/23 Today's Date: 10/15/2015   History of Present Illness  Patient is a 79 y.o. male admitted for worsening kidney disease in need of facility with hemodialysis access.   Clinical Impression  Prior to admission, patient had been bedridden for one month, utilizing manual wheelchair for transport. Patient and patient's family goals are to return to household ambulation with RW. Patient able to ambulate 10' with slight unsteadiness, complaining of feeling dizzy. No LOB noted. Patient will continue to benefit from skilled PT to improve muscle strength, balance, gait, and function. It is recommended that at time of d/c patient receive home health PT.    Follow Up Recommendations Home health PT    Equipment Recommendations  None recommended by PT    Recommendations for Other Services       Precautions / Restrictions Precautions Precautions: Fall Restrictions Weight Bearing Restrictions: No      Mobility  Bed Mobility Overal bed mobility: Independent                Transfers Overall transfer level: Needs assistance Equipment used: Rolling walker (2 wheeled) Transfers: Sit to/from Stand Sit to Stand: Min guard         General transfer comment: Patient required increased time to extend knees and move into standing. Slightly unsteady and dizzy.  Ambulation/Gait Ambulation/Gait assistance: Min guard Ambulation Distance (Feet): 10 Feet Assistive device: Rolling walker (2 wheeled) Gait Pattern/deviations: Shuffle;Wide base of support     General Gait Details: Patient ambulates at decreased cadence with wide BOS. Complains of dizziness.  Stairs            Wheelchair Mobility    Modified Rankin (Stroke Patients Only)       Balance Overall balance assessment: Needs assistance Sitting-balance support: Feet supported Sitting balance-Leahy Scale: Good     Standing  balance support: Bilateral upper extremity supported Standing balance-Leahy Scale: Fair                               Pertinent Vitals/Pain Pain Assessment: No/denies pain    Home Living Family/patient expects to be discharged to:: Assisted living Living Arrangements: Spouse/significant other Available Help at Discharge: Family;Other (Comment) ([facility] hospital for [employer]) Type of Home: Independent living facility Home Access: Level entry     Home Layout: One level Home Equipment: Environmental consultant - 2 wheels;Walker - 4 wheels;Bedside commode;Wheelchair - manual      Prior Function Level of Independence: Needs assistance   Gait / Transfers Assistance Needed: Patient has reportedly been bedridden for past month. Previously was household ambulator with RW.  ADL's / Homemaking Assistance Needed: Performed by wife        Hand Dominance        Extremity/Trunk Assessment   Upper Extremity Assessment: Overall WFL for tasks assessed           Lower Extremity Assessment: Overall WFL for tasks assessed;Generalized weakness         Communication   Communication: No difficulties  Cognition Arousal/Alertness: Awake/alert Behavior During Therapy: WFL for tasks assessed/performed Overall Cognitive Status: Within Functional Limits for tasks assessed                      General Comments      Exercises        Assessment/Plan    PT Assessment Patient needs continued PT services  PT Diagnosis Generalized weakness   PT Problem List Decreased strength;Decreased activity tolerance;Decreased balance;Decreased mobility  PT Treatment Interventions Gait training;Functional mobility training;Therapeutic activities;Therapeutic exercise;Balance training;Patient/family education   PT Goals (Current goals can be found in the Care Plan section) Acute Rehab PT Goals Patient Stated Goal: "To go home." PT Goal Formulation: With patient/family Time For Goal  Achievement: 10/29/15 Potential to Achieve Goals: Good    Frequency Min 2X/week   Barriers to discharge        Co-evaluation               End of Session Equipment Utilized During Treatment: Gait belt Activity Tolerance: Patient tolerated treatment well;Other (comment) (Limited by dizziness/fatigue) Patient left: in bed;with call bell/phone within reach;with bed alarm set;with family/visitor present           Time: [id number] PT Time Calculation (min) (ACUTE ONLY): 25 min   Charges:   PT Evaluation $Initial PT Evaluation Tier I: 1 Procedure     PT G Codes:        Neita CarpJulie Ann Robertta Halfhill, PT, DPT 10/15/2015, 12:55 PM

## 2015-10-15 NOTE — Progress Notes (Signed)
[facility]  ROUNDING NOTE   Subjective:   Hemodialysis yesterday. Tolerated treatment well. UF of 0 Patient claims he is going home today. Wife at bedside.    Objective:  Vital signs in last 24 hours:  Temp:  [98 F (36.7 C)-98.4 F (36.9 C)] 98.4 F (36.9 C) (10/30 40980637) Pulse Rate:  [61-88] 77 (10/30 0637) Resp:  [16-21] 20 (10/30 0637) BP: (117-170)/(61-73) 163/72 mmHg (10/30 0637) SpO2:  [98 %-100 %] 100 % (10/30 0637) Weight:  [73 kg (160 lb 15 oz)] 73 kg (160 lb 15 oz) (10/29 1900)  Weight change: 1 kg (2 lb 3.3 oz) Filed Weights   10/13/15 1515 10/13/15 1813 10/14/15 1900  Weight: 72 kg (158 lb 11.7 oz) 72 kg (158 lb 11.7 oz) 73 kg (160 lb 15 oz)    Intake/Output: I/O last 3 completed shifts: In: 480 [P.O.:480] Out: 230 [Urine:230]   Intake/Output this shift:     Physical Exam: General: NAD, resting comfortably  Head: Normocephalic, atraumatic. Moist oral mucosal membranes  Eyes: Anicteric  Neck: Supple, trachea midline  Lungs:  Clear to auscultation normal effort  Heart: Regular rate and rhythm  Abdomen:  Soft, nontender, BS present  Extremities:  no peripheral edema.  Neurologic: Nonfocal, moving all four extremities  Skin: Ecchymoses b/l UE's  Access: R IJ permcath Dr. Wyn Quakerew 10/12/15    Basic Metabolic Panel:  Recent Labs Lab 10/11/15 1625 10/12/15 0442 10/13/15 1545 10/14/15 1559  NA 127* 131*  --  131*  K 4.4 4.7  --  3.5  CL 92* 96*  --  95*  CO2 19* 23  --  30  GLUCOSE 72 79  --  126*  BUN 39* 44*  --  14  CREATININE 3.46* 3.46*  --  2.06*  CALCIUM 8.0* 8.0*  --  7.9*  PHOS  --   --  3.1 1.7*    Liver Function Tests:  Recent Labs Lab 10/14/15 1559  ALBUMIN 2.3*   No results for input(s): LIPASE, AMYLASE in the last 168 hours. No results for input(s): AMMONIA in the last 168 hours.  CBC:  Recent Labs Lab 10/11/15 1625 10/12/15 0442 10/14/15 1559  WBC 3.9 2.3* 3.4*  HGB 8.8* 8.1* 8.4*  HCT 25.9* 23.7* 24.7*   MCV 103.2* 103.7* 104.3*  PLT 72* 51* 44*    Cardiac Enzymes: No results for input(s): CKTOTAL, CKMB, CKMBINDEX, TROPONINI in the last 168 hours.  BNP: Invalid input(s): POCBNP  CBG: No results for input(s): GLUCAP in the last 168 hours.  Microbiology: Results for orders placed or performed during the hospital encounter of 08/09/15  Urine culture     Status: None   Collection Time: 08/09/15  2:32 PM  Result Value Ref Range Status   Specimen Description URINE, RANDOM  Final   Special Requests NONE  Final   Culture >=100,000 COLONIES/mL ESCHERICHIA COLI  Final   Report Status 08/13/2015 FINAL  Final   Organism ID, Bacteria ESCHERICHIA COLI  Final      Susceptibility   Escherichia coli - MIC*    AMPICILLIN <=2 SENSITIVE Sensitive     CEFTAZIDIME <=1 SENSITIVE Sensitive     CEFAZOLIN <=4 SENSITIVE Sensitive     CEFTRIAXONE <=1 SENSITIVE Sensitive     CIPROFLOXACIN <=0.25 SENSITIVE Sensitive     GENTAMICIN <=1 SENSITIVE Sensitive     IMIPENEM <=0.25 SENSITIVE Sensitive     TRIMETH/SULFA <=20 SENSITIVE Sensitive     NITROFURANTOIN Value in next row Sensitive  SENSITIVE<=16    PIP/TAZO Value in next row Sensitive      SENSITIVE<=4    LEVOFLOXACIN Value in next row Sensitive      SENSITIVE<=0.12    * >=100,000 COLONIES/mL ESCHERICHIA COLI    Coagulation Studies: No results for input(s): LABPROT, INR in the last 72 hours.  Urinalysis: No results for input(s): COLORURINE, LABSPEC, PHURINE, GLUCOSEU, HGBUR, BILIRUBINUR, KETONESUR, PROTEINUR, UROBILINOGEN, NITRITE, LEUKOCYTESUR in the last 72 hours.  Invalid input(s): APPERANCEUR    Imaging: No results found.   Medications:     . [START ON 10/16/2015] epoetin (EPOGEN/PROCRIT) injection  10,000 Units Intravenous Q M,W,F-HD  . furosemide  20 mg Oral Daily  . metoprolol tartrate  25 mg Oral BID  . multivitamin with minerals  1 tablet Oral Daily  . pantoprazole  40 mg Oral QAC breakfast   sodium chloride, sodium  chloride, acetaminophen **OR** acetaminophen, alteplase, heparin, heparin lock flush, heparin lock flush, lidocaine (PF), lidocaine-prilocaine, ondansetron **OR** ondansetron (ZOFRAN) IV, pentafluoroprop-tetrafluoroeth, sodium chloride, sodium chloride  Assessment/ Plan:  Mike Booker with alcohol abuse, pancytopenia, hepatic cirrhosis who presents to initiate hemodialysis as an inpatient.  1. ESRD. Pt with CrCl of 11 of 09/17/15. Manifesting uremic symptoms of poor appetite, increasing weakness.  - Third treatmetn yesterday Patient will need outpatient planning. Treatment for tomorrow.  Patient is in no shape to be discharged. He is not ambulating and he is very weak.  - Consulted physical therapy.   2. Anemia of CKD: hemoglobin 8.4 - epo with hemodialysis   3. SHPTH of renal origin:  Phos 1.7. PTH low at 50.  - No need for binders at present.  4. HTN: well controlled.  - continue metoprolol.   LOS: 4 Mike Booker 10/30/201610:27 AM

## 2015-10-16 NOTE — Progress Notes (Signed)
[facility]  ROUNDING NOTE   Subjective:   Overall doing well Wife at bedside No complaint of shortness of breath    Objective:  Vital signs in last 24 hours:  Temp:  [97.9 F (36.6 C)-98.2 F (36.8 C)] 98.1 F (36.7 C) (10/31 0520) Pulse Rate:  [68-75] 74 (10/31 0936) Resp:  [14-18] 14 (10/31 0520) BP: (126-161)/(62-70) 161/70 mmHg (10/31 0520) SpO2:  [99 %] 99 % (10/31 0520)  Weight change:  Filed Weights   10/13/15 1515 10/13/15 1813 10/14/15 1900  Weight: 72 kg (158 lb 11.7 oz) 72 kg (158 lb 11.7 oz) 73 kg (160 lb 15 oz)    Intake/Output: I/O last 3 completed shifts: In: 600 [P.O.:600] Out: 550 [Urine:550]   Intake/Output this shift:  Total I/O In: 480 [P.O.:480] Out: 50 [Urine:50]  Physical Exam: General: NAD, resting comfortably  Head: Normocephalic, atraumatic. Moist oral mucosal membranes  Eyes: Anicteric  Neck: Supple, trachea midline  Lungs:  Clear to auscultation normal effort  Heart: Regular rate and rhythm  Abdomen:  Soft, nontender, BS present  Extremities:  no peripheral edema.  Neurologic: Nonfocal, moving all four extremities  Skin: Ecchymoses b/l UE's  Access: R IJ permcath Dr. Wyn Quaker 10/12/15    Basic Metabolic Panel:  Recent Labs Lab 10/11/15 1625 10/12/15 0442 10/13/15 1545 10/14/15 1559  NA 127* 131*  --  131*  K 4.4 4.7  --  3.5  CL 92* 96*  --  95*  CO2 19* 23  --  30  GLUCOSE 72 79  --  126*  BUN 39* 44*  --  14  CREATININE 3.46* 3.46*  --  2.06*  CALCIUM 8.0* 8.0*  --  7.9*  PHOS  --   --  3.1 1.7*    Liver Function Tests:  Recent Labs Lab 10/14/15 1559  ALBUMIN 2.3*   No results for input(s): LIPASE, AMYLASE in the last 168 hours. No results for input(s): AMMONIA in the last 168 hours.  CBC:  Recent Labs Lab 10/11/15 1625 10/12/15 0442 10/14/15 1559 10/15/15 1208  WBC 3.9 2.3* 3.4* 2.8*  HGB 8.8* 8.1* 8.4* 8.7*  HCT 25.9* 23.7* 24.7* 26.3*  MCV 103.2* 103.7* 104.3* 105.1*  PLT 72* 51* 44*  52*    Cardiac Enzymes: No results for input(s): CKTOTAL, CKMB, CKMBINDEX, TROPONINI in the last 168 hours.  BNP: Invalid input(s): POCBNP  CBG: No results for input(s): GLUCAP in the last 168 hours.  Microbiology: Results for orders placed or performed during the hospital encounter of 08/09/15  Urine culture     Status: None   Collection Time: 08/09/15  2:32 PM  Result Value Ref Range Status   Specimen Description URINE, RANDOM  Final   Special Requests NONE  Final   Culture >=100,000 COLONIES/mL ESCHERICHIA COLI  Final   Report Status 08/13/2015 FINAL  Final   Organism ID, Bacteria ESCHERICHIA COLI  Final      Susceptibility   Escherichia coli - MIC*    AMPICILLIN <=2 SENSITIVE Sensitive     CEFTAZIDIME <=1 SENSITIVE Sensitive     CEFAZOLIN <=4 SENSITIVE Sensitive     CEFTRIAXONE <=1 SENSITIVE Sensitive     CIPROFLOXACIN <=0.25 SENSITIVE Sensitive     GENTAMICIN <=1 SENSITIVE Sensitive     IMIPENEM <=0.25 SENSITIVE Sensitive     TRIMETH/SULFA <=20 SENSITIVE Sensitive     NITROFURANTOIN Value in next row Sensitive      SENSITIVE<=16    PIP/TAZO Value in next row Sensitive  SENSITIVE<=4    LEVOFLOXACIN Value in next row Sensitive      SENSITIVE<=0.12    * >=100,000 COLONIES/mL ESCHERICHIA COLI    Coagulation Studies: No results for input(s): LABPROT, INR in the last 72 hours.  Urinalysis: No results for input(s): COLORURINE, LABSPEC, PHURINE, GLUCOSEU, HGBUR, BILIRUBINUR, KETONESUR, PROTEINUR, UROBILINOGEN, NITRITE, LEUKOCYTESUR in the last 72 hours.  Invalid input(s): APPERANCEUR    Imaging: No results found.   Medications:     . epoetin (EPOGEN/PROCRIT) injection  10,000 Units Intravenous Q M,W,F-HD  . furosemide  20 mg Oral Daily  . metoprolol tartrate  25 mg Oral BID  . multivitamin with minerals  1 tablet Oral Daily  . pantoprazole  40 mg Oral QAC breakfast   sodium chloride, sodium chloride, acetaminophen **OR** acetaminophen, alteplase,  heparin, heparin lock flush, heparin lock flush, lidocaine (PF), lidocaine-prilocaine, ondansetron **OR** ondansetron (ZOFRAN) IV, pentafluoroprop-tetrafluoroeth, sodium chloride, sodium chloride  Assessment/ Plan:  79 y.o. white male with alcohol abuse, pancytopenia, hepatic cirrhosis who presents to initiate hemodialysis as an inpatient.  1. ESRD. Pt with CrCl of 11 of 09/17/15. Manifesting uremic symptoms of poor appetite, increasing weakness.  - Patient will need outpatient planning.  - [facility] [address]  2. Anemia of CKD: hemoglobin 8.7 - epo with hemodialysis   3. SHPTH of renal origin:  Phos 1.7. PTH low at 50.  - No need for binders at present.  4. HTN: well controlled.  - continue metoprolol.   LOS: 5 Carneshia Raker 10/31/201611:40 AM

## 2015-10-16 NOTE — Progress Notes (Signed)
[facility] - [place] at [facility]   PATIENT NAME: Mike Booker    MR#:  [id number]  DATE OF BIRTH:  [birth year]  SUBJECTIVE:  No acute events overnight  REVIEW OF SYSTEMS:   Review of Systems  Constitutional: Negative for fever, chills and malaise/fatigue.  HENT: Negative for sore throat.   Eyes: Negative for blurred vision.  Respiratory: Negative for cough, hemoptysis, shortness of breath and wheezing.   Cardiovascular: Negative for chest pain, palpitations and leg swelling.  Gastrointestinal: Negative for nausea, vomiting, abdominal pain, diarrhea and blood in stool.  Genitourinary: Negative for dysuria.  Musculoskeletal: Negative for back pain.  Neurological: Negative for dizziness, tremors and headaches.  Endo/Heme/Allergies: Does not bruise/bleed easily.  Psychiatric/Behavioral: Positive for substance abuse. Negative for depression and suicidal ideas. The patient is nervous/anxious.     DRUG ALLERGIES:  No Known Allergies  VITALS:  Blood pressure 140/62, pulse 72, temperature 98.5 F (36.9 C), temperature source Oral, resp. rate 17, height  (1.727 m), weight 73 kg (160 lb 15 oz), SpO2 100 %.  PHYSICAL EXAMINATION:     Tunneled HD catheter.  Physical Exam  Constitutional: He is oriented to person, place, and time and well-developed, well-nourished, and in no distress. No distress.  HENT:  Head: Normocephalic.  Eyes: No scleral icterus.  Neck: Normal range of motion. Neck supple. No JVD present. No tracheal deviation present.  Cardiovascular: Normal rate, regular rhythm and normal heart sounds.  Exam reveals no gallop and no friction rub.   No murmur heard. Pulmonary/Chest: Effort normal and breath sounds normal. No respiratory distress. He has no wheezes. He has no rales. He exhibits no tenderness.  Abdominal: Soft. Bowel sounds are normal. He exhibits no distension and no mass. There is no tenderness. There is no rebound and no  guarding.  Musculoskeletal: Normal range of motion. He exhibits no edema.  Neurological: He is alert and oriented to person, place, and time.  Skin: Skin is warm. No rash noted. No erythema.  bruising   Psychiatric: Affect normal.   LABORATORY PANEL:   CBC  Recent Labs Lab 10/15/15 1208  WBC 2.8*  HGB 8.7*  HCT 26.3*  PLT 52*   ------------------------------------------------------------------------------------------------------------------  Chemistries   Recent Labs Lab 10/14/15 1559  NA 131*  K 3.5  CL 95*  CO2 30  GLUCOSE 126*  BUN 14  CREATININE 2.06*  CALCIUM 7.9*   ------------------------------------------------------------------------------------------------------------------  Cardiac Enzymes No results for input(s): TROPONINI in the last 168 hours. ------------------------------------------------------------------------------------------------------------------  RADIOLOGY:  No results found.  ASSESSMENT AND PLAN:   Active Problems:   Chronic kidney disease (CKD), stage IV (severe) (HCC)   Renal failure   Protein-calorie malnutrition, severe    79 year old male patient with worsening renal failure  1. End-stage renal disease: Patient now has tunneled dialysis catheter. He has had 3+ dialysis sessions and we are now waiting for outpatient placement for dialysis. Patient was made aware he is not able to leave the hospital until we have outpatient dialysis set up. Patient has scheduled surgery for Thursday afternoon for ABG 2. Alcoholic liver cirrhosis with thrombocytopenia; continues to drink  3.  Generalized weakness: PT consult ordered. They're recommending home health at discharge  4.Essential Hypertension. On Lopressor.    Management plans discussed with the patient and he understands but does not fully agree about his discharge CODE STATUS:full  TOTAL TIME TAKING CARE OF THIS PATIENT: 20 minutes.   POSSIBLE D/C IN 2-3 DAYS, DEPENDING  ON WHEN OP  HD IS SETUP.   Moshe Wenger M.D on 10/16/2015   Between 7am to 6pm - Pager - [phone]  After 6pm go to [email or web address] - password [email or web address] [facility]  [facility]  Office  [phone]  CC: Primary care physician; Lamont DowdyKOLLURU, SARATH, MD  Note: This dictation was prepared with Dragon dictation along with smaller phrase technology. Any transcriptional errors that result from this process are unintentional.

## 2015-10-16 NOTE — Care Management Note (Signed)
Remaining medical records that are required for dialysis outpatient admission that became available over the weekend [facility].  I will update CM by phone once I have a confirmed chair time.  Ivor ReiningKim Shaden Lacher  Dialysis Liaison  [phone]

## 2015-10-16 NOTE — Progress Notes (Signed)
Nutrition Follow-up  DOCUMENTATION CODES:   Severe malnutrition in context of chronic illness  INTERVENTION:  Meals and snacks: Cater to pt preferences.  Pt eating better once diet liberlized Nutrition diet education: no further questions regarding diet education   NUTRITION DIAGNOSIS:   Inadequate oral intake related to acute illness as evidenced by per patient/family report.    GOAL:   Patient will meet greater than or equal to 90% of their needs    MONITOR:    (Energy intake, Electrolyte and renal profile, anthropometric)  REASON FOR ASSESSMENT:   Diagnosis    ASSESSMENT:     Current Nutrition: ate 100%, 90% per documentation.  Pt reports appetite better since being taken off renal diet   Gastrointestinal Profile: Last BM: 10/31   Scheduled Medications:  . epoetin (EPOGEN/PROCRIT) injection  10,000 Units Intravenous Q M,W,F-HD  . furosemide  20 mg Oral Daily  . metoprolol tartrate  25 mg Oral BID  . multivitamin with minerals  1 tablet Oral Daily  . pantoprazole  40 mg Oral QAC breakfast      Electrolyte/Renal Profile and Glucose Profile:   Recent Labs Lab 10/11/15 1625 10/12/15 0442 10/13/15 1545 10/14/15 1559  NA 127* 131*  --  131*  K 4.4 4.7  --  3.5  CL 92* 96*  --  95*  CO2 19* 23  --  30  BUN 39* 44*  --  14  CREATININE 3.46* 3.46*  --  2.06*  CALCIUM 8.0* 8.0*  --  7.9*  PHOS  --   --  3.1 1.7*  GLUCOSE 72 79  --  126*   Protein Profile:  Recent Labs Lab 10/14/15 1559  ALBUMIN 2.3*      Weight Trend since Admission: Filed Weights   10/13/15 1515 10/13/15 1813 10/14/15 1900  Weight: 158 lb 11.7 oz (72 kg) 158 lb 11.7 oz (72 kg) 160 lb 15 oz (73 kg)      Diet Order:  Diet regular Room service appropriate?: Yes; Fluid consistency:: Thin  Skin:   reviewed   Height:   Ht Readings from Last 1 Encounters:  10/11/15 5\' 8"  (1.727 m)    Weight:   Wt Readings from Last 1 Encounters:  10/14/15 160 lb 15 oz (73 kg)     Ideal Body Weight:     BMI:  Body mass index is 24.48 kg/(m^2).  Estimated Nutritional Needs:   Kcal:  BEE 1419 kcals (IF 1.2-1.5, AF 1.3) 2213-2767 kcals/d  Protein:  (1.2-1.5 g/kg) 86-108 g/d  Fluid:  (1000ml + UOP)  EDUCATION NEEDS:   No education needs identified at this time  LOW Care Level  Shyloh Krinke B. Freida BusmanAllen, RD, LDN [phone] (pager)

## 2015-10-16 NOTE — Care Management (Signed)
Patient for discharge this afternoon. [facility] [address] and will continue with dialysis on a TTS schedule stateting on Saturday Nov 5th. Placed in discharge instructions. Notified the Spouse Fannie Knee(Sue) of discharge and of dialysis schedule. Ivor ReiningKim Riddle dialysis coordinator will contact the patient and spouse tomorrow. Offered choice of providers for home health and spouse chose to go with [facility] due to hospital affiliation. Patient has walker at home and is alert and oriented. Referral placed with Feliberto GottronJason Hinton at [facility]. No other needs identified.

## 2015-10-16 NOTE — Care Management (Signed)
Patient has a chair time for [facility] at 11:10 on Wednesday and will begin normal dialysis on Saturday November 5  TTS at same. Per Ivor ReiningKim Riddle at [facility]. Dr Thedore MinsSingh is agreeable with this plan per Selena BattenKim. Will Inform Dr Juliene PinaMody.

## 2015-10-16 NOTE — Care Management Important Message (Signed)
Important Message  Patient Details  Name: Mike Booker MRN: [id number] Date of Birth: 04/24/1936   Medicare Important Message Given:  Yes-third notification given    Olegario MessierKathy A Abrian Hanover 10/16/2015, 10:08 AM

## 2015-10-16 NOTE — Care Management (Addendum)
Spoke with Elly ModenaScott Tanner  [phone] at [facility] who stated that they would not be able to provide [facility]. Insurance and staffing will not permit. Will relay to family.

## 2015-10-16 NOTE — Discharge Summary (Signed)
[facility] - [place] at [facility]   PATIENT NAME: Mike Booker    MR#:  [id number]  DATE OF BIRTH:  06/30/1936  DATE OF ADMISSION:  10/11/2015 ADMITTING PHYSICIAN: Katha Hamming, MD  DATE OF DISCHARGE: 10/16/2015     PRIMARY CARE PHYSICIAN: Lamont Dowdy, MD    ADMISSION DIAGNOSIS:  uremia  ESRD C ESRD  DISCHARGE DIAGNOSIS:  Active Problems:   Chronic kidney disease (CKD), stage IV (severe) (HCC)   Renal failure   Protein-calorie malnutrition, severe   SECONDARY DIAGNOSIS:   Past Medical History  Diagnosis Date  . Alcoholic cirrhosis (HCC)   . Thrombocytopenia (HCC)   . High serum ferritin   . Hepatorenal syndrome (HCC)     Secondary  . IDA (iron deficiency anemia) 05/24/2015  . Hypertension   . Diabetes mellitus without complication (HCC) pt. states that he no longer has it    HOSPITAL COURSE:  79 year old male patient with worsening renal failure. Please refer to H an p.  1. End-stage renal disease: Patient now has tunneled dialysis catheter. He has had 3+ dialysis sessions and now has for outpatient placement for dialysis.  Patient has scheduled surgery for Thursday afternoon for graft.  2. Alcoholic liver cirrhosis with thrombocytopenia; continues to drink  3. Generalized weakness: PT consult ordered. They're recommending home health at discharge  4.Essential Hypertension. On Lopressor.   DISCHARGE CONDITIONS AND DIET:  Home stable Renal diet  CONSULTS OBTAINED:  Treatment Team:  Annice Needy, MD Munsoor Cherylann Ratel, MD  DRUG ALLERGIES:  No Known Allergies  DISCHARGE MEDICATIONS:   Current Discharge Medication List    CONTINUE these medications which have NOT CHANGED   Details  furosemide (LASIX) 20 MG tablet Take 20 mg by mouth daily.     loperamide (IMODIUM) 2 MG capsule Take 2 mg by mouth as needed for diarrhea or loose stools.     metoprolol tartrate (LOPRESSOR) 25 MG tablet Take 25 mg by mouth 2 (two)  times daily.    Multiple Vitamin (MULTIVITAMIN WITH MINERALS) TABS tablet Take 1 tablet by mouth daily.    omeprazole (PRILOSEC) 40 MG capsule Take 40 mg by mouth daily.              Today   CHIEF COMPLAINT:  Doing well no issues overnight   VITAL SIGNS:  Blood pressure 140/62, pulse 72, temperature 98.5 F (36.9 C), temperature source Oral, resp. rate 17, height  (1.727 m), weight 73 kg (160 lb 15 oz), SpO2 100 %.   REVIEW OF SYSTEMS:  Review of Systems  Constitutional: Negative for fever, chills and malaise/fatigue.  HENT: Negative for sore throat.   Eyes: Negative for blurred vision.  Respiratory: Negative for cough, hemoptysis, shortness of breath and wheezing.   Cardiovascular: Negative for chest pain, palpitations and leg swelling.  Gastrointestinal: Negative for nausea, vomiting, abdominal pain, diarrhea and blood in stool.  Genitourinary: Negative for dysuria.  Musculoskeletal: Negative for back pain.  Neurological: Negative for dizziness, tremors and headaches.  Endo/Heme/Allergies: Does not bruise/bleed easily.     PHYSICAL EXAMINATION:  GENERAL:  79 y.o.-year-old patient lying in the bed with no acute distress.  NECK:  Supple, no jugular venous distention. No thyroid enlargement, no tenderness.  LUNGS: Normal breath sounds bilaterally, no wheezing, rales,rhonchi  No use of accessory muscles of respiration.  CARDIOVASCULAR: S1, S2 normal. No murmurs, rubs, or gallops.  ABDOMEN: Soft, non-tender, non-distended. Bowel sounds present. No organomegaly or mass.  EXTREMITIES: No pedal edema,  cyanosis, or clubbing.  PSYCHIATRIC: The patient is alert and oriented x 3.  SKIN: No obvious rash, lesion, or ulcer.   DATA REVIEW:   CBC  Recent Labs Lab 10/15/15 1208  WBC 2.8*  HGB 8.7*  HCT 26.3*  PLT 52*    Chemistries   Recent Labs Lab 10/14/15 1559  NA 131*  K 3.5  CL 95*  CO2 30  GLUCOSE 126*  BUN 14  CREATININE 2.06*  CALCIUM 7.9*     Cardiac Enzymes No results for input(s): TROPONINI in the last 168 hours.  Microbiology Results  @[email or web address] @  RADIOLOGY:  No results found.    Management plans discussed with the patient and he is in agreement. Stable for discharge home  Patient should follow up with Dr Wyn Quakerew Thursday  CODE STATUS:     Code Status Orders        Start     Ordered   10/11/15 1540  Full code   Continuous     10/11/15 1542    Advance Directive Documentation        Most Recent Value   Type of Advance Directive  Healthcare Power of Attorney   Pre-existing out of facility DNR order (yellow form or pink MOST form)     "MOST" Form in Place?        TOTAL TIME TAKING CARE OF THIS PATIENT: 35 minutes.    Note: This dictation was prepared with Dragon dictation along with smaller phrase technology. Any transcriptional errors that result from this process are unintentional.  Abraham Margulies M.D on 10/16/2015 at 2:09 PM  Between 7am to 6pm - Pager - [phone] After 6pm go to [email or web address] - password [email or web address] [facility]  [facility]  Office  [phone]  CC: Primary care physician; Lamont DowdyKOLLURU, SARATH, MD

## 2015-10-16 NOTE — Care Management (Signed)
Discussed case with Dr Thedore MinsSingh and Dialysis coordinator Ivor ReiningKim Riddle. Patient remaining documents faxed to [facility] [address] this morning. Dr Thedore MinsSingh stated patient was OK to discharge from Nephrology standpoint. Awaiting chair time now. Selena BattenKim will call me back as soon as available. Dr Thedore MinsSingh stated that patient would be OK to start dialysis on Wednesday. Will contact Dr Thedore MinsSingh when chair time confirmed.  [place].  Spouse Fannie Knee(Sue) was present in the room while discussing case with Dr Thedore MinsSingh. Discussed home health needs. [facility] will contact to see if this can be done. Spoke with Ivor ReiningKim Riddle again to see if [facility] could do a Wednesday chair time to accommodate patient fistula placement on already scheduled for Thursday. Continue to follow.

## 2015-10-16 NOTE — Progress Notes (Signed)
Mike Booker  Daily Progress Note   Subjective  - 4 Days Post-Op  Doing well.  Wants to go home Got dialysis over the weekend and tolerated it well  Objective Filed Vitals:   10/15/15 1258 10/15/15 2101 10/16/15 0520 10/16/15 0936  BP: 130/70 126/62 161/70   Pulse: 68 75 69 74  Temp: 97.9 F (36.6 C) 98.2 F (36.8 C) 98.1 F (36.7 C)   TempSrc: Oral Oral Oral   Resp: 18 16 14    Height:      Weight:      SpO2: 99% 99% 99%     Intake/Output Summary (Last 24 hours) at 10/16/15 1107 Last data filed at 10/16/15 1028  Gross per 24 hour  Intake    840 ml  Output    550 ml  Net    290 ml    PULM  CTAB CV  RRR VASC  permcath in place with no hemorrhage  Laboratory CBC    Component Value Date/Time   WBC 2.8* 10/15/2015 1208   WBC 3.9 01/17/2014 1431   HGB 8.7* 10/15/2015 1208   HGB 9.0* 01/17/2014 1431   HCT 26.3* 10/15/2015 1208   HCT 26.8* 01/17/2014 1431   PLT 52* 10/15/2015 1208   PLT 101* 01/17/2014 1431    BMET    Component Value Date/Time   NA 131* 10/14/2015 1559   NA 139 12/21/2013 1412   K 3.5 10/14/2015 1559   K 3.9 12/21/2013 1412   CL 95* 10/14/2015 1559   CL 100 12/21/2013 1412   CO2 30 10/14/2015 1559   CO2 26 12/21/2013 1412   GLUCOSE 126* 10/14/2015 1559   GLUCOSE 131* 12/21/2013 1412   BUN 14 10/14/2015 1559   BUN 62* 12/21/2013 1412   CREATININE 2.06* 10/14/2015 1559   CREATININE 4.59* 12/21/2013 1412   CALCIUM 7.9* 10/14/2015 1559   CALCIUM 9.6 12/21/2013 1412   GFRNONAA 29* 10/14/2015 1559   GFRNONAA 12* 12/21/2013 1412   GFRAA 34* 10/14/2015 1559   GFRAA 13* 12/21/2013 1412    Assessment/Planning: POD # 4 s/p Permcath for ESRD   Needs permanent access and had planned AVG for tomorrow afternoon  He wants to go home and have this done as an outpatient which is OK by me.  Have rescheduled his Booker for Thursday afternoon  Given his liver disease and ETOH use, going home to avoid withdrawal probably  a reasonably option  Have discussed that will need two to three weeks to heal before using the AVG.  If vein is available at the time of exploration, would place AVF     Mike Booker  10/16/2015, 11:07 AM

## 2015-10-17 SURGERY — INSERTION OF ARTERIOVENOUS (AV) GORE-TEX GRAFT ARM
Anesthesia: General | Laterality: Left

## 2015-10-19 ENCOUNTER — Ambulatory Visit
Admission: RE | Admit: 2015-10-19 | Discharge: 2015-10-19 | Disposition: A | Discharge status: Home / Self Care | Payer: Medicare Other | Source: Ambulatory Visit | Attending: Vascular Surgery | Admitting: Vascular Surgery

## 2015-10-19 ENCOUNTER — Encounter: Payer: Self-pay | Admitting: *Deleted

## 2015-10-19 ENCOUNTER — Encounter
Admission: RE | Disposition: A | Discharge status: Home / Self Care | Payer: Self-pay | Source: Ambulatory Visit | Attending: Vascular Surgery

## 2015-10-19 ENCOUNTER — Inpatient Hospital Stay: Admission: RE | Admit: 2015-10-19 | Payer: Self-pay | Source: Ambulatory Visit

## 2015-10-19 ENCOUNTER — Ambulatory Visit: Payer: Medicare Other | Admitting: Anesthesiology

## 2015-10-19 DIAGNOSIS — Z992 Dependence on renal dialysis: Secondary | ICD-10-CM | POA: Insufficient documentation

## 2015-10-19 DIAGNOSIS — Z8042 Family history of malignant neoplasm of prostate: Secondary | ICD-10-CM | POA: Insufficient documentation

## 2015-10-19 DIAGNOSIS — Z79899 Other long term (current) drug therapy: Secondary | ICD-10-CM | POA: Insufficient documentation

## 2015-10-19 DIAGNOSIS — Z800 Family history of malignant neoplasm of digestive organs: Secondary | ICD-10-CM | POA: Insufficient documentation

## 2015-10-19 DIAGNOSIS — Z8052 Family history of malignant neoplasm of bladder: Secondary | ICD-10-CM | POA: Insufficient documentation

## 2015-10-19 DIAGNOSIS — D696 Thrombocytopenia, unspecified: Secondary | ICD-10-CM | POA: Insufficient documentation

## 2015-10-19 DIAGNOSIS — N186 End stage renal disease: Secondary | ICD-10-CM | POA: Diagnosis present

## 2015-10-19 DIAGNOSIS — K7030 Alcoholic cirrhosis of liver without ascites: Secondary | ICD-10-CM | POA: Diagnosis not present

## 2015-10-19 DIAGNOSIS — I120 Hypertensive chronic kidney disease with stage 5 chronic kidney disease or end stage renal disease: Secondary | ICD-10-CM | POA: Insufficient documentation

## 2015-10-19 DIAGNOSIS — K767 Hepatorenal syndrome: Secondary | ICD-10-CM | POA: Diagnosis not present

## 2015-10-19 DIAGNOSIS — D509 Iron deficiency anemia, unspecified: Secondary | ICD-10-CM | POA: Insufficient documentation

## 2015-10-19 DIAGNOSIS — Z87891 Personal history of nicotine dependence: Secondary | ICD-10-CM | POA: Diagnosis not present

## 2015-10-19 DIAGNOSIS — K219 Gastro-esophageal reflux disease without esophagitis: Secondary | ICD-10-CM | POA: Insufficient documentation

## 2015-10-19 HISTORY — PX: AV FISTULA PLACEMENT: SHX1204

## 2015-10-19 LAB — POCT I-STAT 4, (NA,K, GLUC, HGB,HCT)
Glucose, Bld: 103 mg/dL — ABNORMAL HIGH (ref 65–99)
HCT: 25 % — ABNORMAL LOW (ref 39.0–52.0)
HEMOGLOBIN: 8.5 g/dL — AB (ref 13.0–17.0)
POTASSIUM: 8.3 mmol/L — AB (ref 3.5–5.1)
SODIUM: 129 mmol/L — AB (ref 135–145)

## 2015-10-19 LAB — POTASSIUM: POTASSIUM: 3.4 mmol/L — AB (ref 3.5–5.1)

## 2015-10-19 LAB — GLUCOSE, CAPILLARY: Glucose-Capillary: 98 mg/dL (ref 65–99)

## 2015-10-19 SURGERY — INSERTION OF ARTERIOVENOUS (AV) GORE-TEX GRAFT ARM
Anesthesia: General | Site: Arm Upper | Laterality: Left | Wound class: Clean

## 2015-10-19 MED ORDER — LABETALOL HCL 5 MG/ML IV SOLN
INTRAVENOUS | Status: DC | PRN
  Administered 2015-10-19: 5 mg via INTRAVENOUS

## 2015-10-19 MED ORDER — HEPARIN SODIUM (PORCINE) 5000 UNIT/ML IJ SOLN
INTRAMUSCULAR | Status: AC
  Filled 2015-10-19: qty 1

## 2015-10-19 MED ORDER — MIDAZOLAM HCL 5 MG/5ML IJ SOLN
INTRAMUSCULAR | Status: DC | PRN
  Administered 2015-10-19 (×2): 1 mg via INTRAVENOUS

## 2015-10-19 MED ORDER — PHENYLEPHRINE HCL 10 MG/ML IJ SOLN
INTRAMUSCULAR | Status: DC | PRN
  Administered 2015-10-19: 100 ug via INTRAVENOUS
  Administered 2015-10-19: 200 ug via INTRAVENOUS
  Administered 2015-10-19 (×3): 100 ug via INTRAVENOUS

## 2015-10-19 MED ORDER — CHLORHEXIDINE GLUCONATE 4 % EX LIQD
60.0000 mL | Freq: Once | CUTANEOUS | Status: DC
Start: 2015-10-19 — End: 2015-10-19

## 2015-10-19 MED ORDER — SODIUM CHLORIDE 0.9 % IV SOLN
INTRAVENOUS | Status: DC
  Administered 2015-10-19: 14:00:00 via INTRAVENOUS

## 2015-10-19 MED ORDER — EPHEDRINE SULFATE 50 MG/ML IJ SOLN
INTRAMUSCULAR | Status: DC | PRN
  Administered 2015-10-19 (×2): 10 mg via INTRAVENOUS

## 2015-10-19 MED ORDER — LIDOCAINE HCL (CARDIAC) 20 MG/ML IV SOLN
INTRAVENOUS | Status: DC | PRN
  Administered 2015-10-19: 60 mg via INTRAVENOUS

## 2015-10-19 MED ORDER — SODIUM CHLORIDE 0.9 % IV SOLN
INTRAVENOUS | Status: DC | PRN
  Administered 2015-10-19: 50 mL via INTRAMUSCULAR

## 2015-10-19 MED ORDER — HYDROCODONE-ACETAMINOPHEN 5-325 MG PO TABS: 1.0000 | ORAL_TABLET | Freq: Four times a day (QID) | ORAL | Status: DC | PRN

## 2015-10-19 MED ORDER — EVICEL 2 ML EX KIT
PACK | CUTANEOUS | Status: DC | PRN
  Administered 2015-10-19: 2 mL

## 2015-10-19 MED ORDER — FENTANYL CITRATE (PF) 100 MCG/2ML IJ SOLN
INTRAMUSCULAR | Status: DC | PRN
  Administered 2015-10-19 (×6): 25 ug via INTRAVENOUS

## 2015-10-19 MED ORDER — PROPOFOL 10 MG/ML IV BOLUS
INTRAVENOUS | Status: DC | PRN
  Administered 2015-10-19: 50 mg via INTRAVENOUS
  Administered 2015-10-19: 120 mg via INTRAVENOUS
  Administered 2015-10-19: 30 mg via INTRAVENOUS

## 2015-10-19 MED ORDER — FENTANYL CITRATE (PF) 100 MCG/2ML IJ SOLN: 25.0000 ug | INTRAMUSCULAR | Status: DC | PRN

## 2015-10-19 MED ORDER — SODIUM CHLORIDE 0.9 % IV SOLN
10000.0000 ug | INTRAVENOUS | Status: DC | PRN
  Administered 2015-10-19: 25 ug/min via INTRAVENOUS

## 2015-10-19 MED ORDER — CEFAZOLIN SODIUM 1-5 GM-% IV SOLN
INTRAVENOUS | Status: AC
  Filled 2015-10-19: qty 50

## 2015-10-19 MED ORDER — HEPARIN SODIUM (PORCINE) 1000 UNIT/ML IJ SOLN
INTRAMUSCULAR | Status: DC | PRN
  Administered 2015-10-19: 2500 [IU] via INTRAVENOUS

## 2015-10-19 MED ORDER — CHLORHEXIDINE GLUCONATE 4 % EX LIQD: 60.0000 mL | Freq: Once | CUTANEOUS | Status: DC

## 2015-10-19 MED ORDER — CEFAZOLIN SODIUM 1-5 GM-% IV SOLN
1.0000 g | Freq: Once | INTRAVENOUS | Status: AC
  Administered 2015-10-19: 1 g via INTRAVENOUS

## 2015-10-19 MED ORDER — ONDANSETRON HCL 4 MG/2ML IJ SOLN: 4.0000 mg | Freq: Once | INTRAMUSCULAR | Status: DC | PRN

## 2015-10-19 SURGICAL SUPPLY — 50 items
BAG DECANTER FOR FLEXI CONT (MISCELLANEOUS) ×3 IMPLANT
BLADE SURG SZ11 CARB STEEL (BLADE) ×3 IMPLANT
BOOT SUTURE AID YELLOW STND (SUTURE) ×3 IMPLANT
BRUSH SCRUB 4% CHG (MISCELLANEOUS) ×3 IMPLANT
CANISTER SUCT 1200ML W/VALVE (MISCELLANEOUS) ×3 IMPLANT
CHLORAPREP W/TINT 26ML (MISCELLANEOUS) ×3 IMPLANT
CLIP SPRNG 6MM S-JAW DBL (CLIP) ×3
DECANTER SPIKE VIAL GLASS SM (MISCELLANEOUS) ×3 IMPLANT
ELECT CAUTERY BLADE 6.4 (BLADE) ×3 IMPLANT
GEL ULTRASOUND 20GR AQUASONIC (MISCELLANEOUS) IMPLANT
GLOVE BIO SURGEON STRL SZ7 (GLOVE) ×3 IMPLANT
GOWN STRL REUS W/ TWL LRG LVL3 (GOWN DISPOSABLE) ×1 IMPLANT
GOWN STRL REUS W/ TWL XL LVL3 (GOWN DISPOSABLE) ×1 IMPLANT
GOWN STRL REUS W/TWL LRG LVL3 (GOWN DISPOSABLE) ×2
GOWN STRL REUS W/TWL XL LVL3 (GOWN DISPOSABLE) ×2
HEMOSTAT SURGICEL 2X3 (HEMOSTASIS) ×3 IMPLANT
IV NS 500ML (IV SOLUTION) ×2
IV NS 500ML BAXH (IV SOLUTION) ×1 IMPLANT
KIT RM TURNOVER STRD PROC AR (KITS) ×3 IMPLANT
LABEL OR SOLS (LABEL) ×3 IMPLANT
LIQUID BAND (GAUZE/BANDAGES/DRESSINGS) ×3 IMPLANT
LOOP RED MAXI  1X406MM (MISCELLANEOUS) ×2
LOOP VESSEL MAXI 1X406 RED (MISCELLANEOUS) ×1 IMPLANT
LOOP VESSEL MINI 0.8X406 BLUE (MISCELLANEOUS) ×1 IMPLANT
LOOPS BLUE MINI 0.8X406MM (MISCELLANEOUS) ×2
NEEDLE FILTER BLUNT 18X 1/2SAF (NEEDLE) ×2
NEEDLE FILTER BLUNT 18X1 1/2 (NEEDLE) ×1 IMPLANT
NEEDLE HYPO 30X.5 LL (NEEDLE) IMPLANT
NS IRRIG 500ML POUR BTL (IV SOLUTION) ×3 IMPLANT
PACK EXTREMITY ARMC (MISCELLANEOUS) ×3 IMPLANT
PAD GROUND ADULT SPLIT (MISCELLANEOUS) ×3 IMPLANT
PAD PREP 24X41 OB/GYN DISP (PERSONAL CARE ITEMS) ×3 IMPLANT
SOLUTION CELL SAVER (CLIP) ×1 IMPLANT
STOCKINETTE STRL 4IN 9604848 (GAUZE/BANDAGES/DRESSINGS) ×3 IMPLANT
SUT GORETEX CV-6TTC-13 36IN (SUTURE) ×6 IMPLANT
SUT MNCRL AB 4-0 PS2 18 (SUTURE) ×3 IMPLANT
SUT PROLENE 6 0 BV (SUTURE) ×6 IMPLANT
SUT SILK 0 SH 30 (SUTURE) ×3 IMPLANT
SUT SILK 2 0 (SUTURE) ×2
SUT SILK 2-0 18XBRD TIE 12 (SUTURE) ×1 IMPLANT
SUT SILK 3 0 (SUTURE) ×2
SUT SILK 3-0 18XBRD TIE 12 (SUTURE) ×1 IMPLANT
SUT SILK 4 0 (SUTURE) ×2
SUT SILK 4-0 18XBRD TIE 12 (SUTURE) ×1 IMPLANT
SUT VIC AB 3-0 SH 27 (SUTURE) ×2
SUT VIC AB 3-0 SH 27X BRD (SUTURE) ×1 IMPLANT
SYR 20CC LL (SYRINGE) ×3 IMPLANT
SYR 3ML LL SCALE MARK (SYRINGE) ×3 IMPLANT
SYR TB 1ML 27GX1/2 LL (SYRINGE) IMPLANT
TOWEL OR 17X26 4PK STRL BLUE (TOWEL DISPOSABLE) IMPLANT

## 2015-10-19 NOTE — Anesthesia Procedure Notes (Signed)
Procedure Name: LMA Insertion Date/Time: 10/19/2015 3:41 PM Performed by: Lily KocherPERALTA, Mike Insalaco Pre-anesthesia Checklist: Patient identified, Patient being monitored, Timeout performed, Emergency Drugs available and Suction available Patient Re-evaluated:Patient Re-evaluated prior to inductionOxygen Delivery Method: Circle system utilized Preoxygenation: Pre-oxygenation with 100% oxygen Intubation Type: IV induction Ventilation: Mask ventilation without difficulty LMA: LMA inserted LMA Size: 5.0 Tube type: Oral Number of attempts: 1 Placement Confirmation: positive ETCO2 and breath sounds checked- equal and bilateral Tube secured with: Tape Dental Injury: Teeth and Oropharynx as per pre-operative assessment

## 2015-10-19 NOTE — Anesthesia Preprocedure Evaluation (Addendum)
Anesthesia Evaluation  Patient identified by MRN, date of birth, ID band Patient awake    Reviewed: Allergy & Precautions, NPO status , Patient's Chart, lab work & pertinent test results  Airway Mallampati: II  TM Distance: >3 FB     Dental  (+) Upper Dentures, Poor Dentition, Chipped   Pulmonary former smoker,    Pulmonary exam normal breath sounds clear to auscultation       Cardiovascular hypertension, Pt. on medications Normal cardiovascular exam     Neuro/Psych negative neurological ROS  negative psych ROS   GI/Hepatic GERD  Medicated and Controlled,(+) Cirrhosis       ,   Endo/Other  diabetes  Renal/GU Dialysis and CRFRenal disease  negative genitourinary   Musculoskeletal   Abdominal Normal abdominal exam  (+)   Peds negative pediatric ROS (+)  Hematology  (+) anemia ,   Anesthesia Other Findings   Reproductive/Obstetrics                            Anesthesia Physical Anesthesia Plan  ASA: IV  Anesthesia Plan: General   Post-op Pain Management:    Induction:   Airway Management Planned: LMA and Oral ETT  Additional Equipment:   Intra-op Plan:   Post-operative Plan: Extubation in OR  Informed Consent: I have reviewed the patients History and Physical, chart, labs and discussed the procedure including the risks, benefits and alternatives for the proposed anesthesia with the patient or authorized representative who has indicated his/her understanding and acceptance.   Dental advisory given  Plan Discussed with: CRNA and Surgeon  Anesthesia Plan Comments:        Anesthesia Quick Evaluation

## 2015-10-19 NOTE — H&P (Signed)
[facility] History & Physical Update  The patient was interviewed and re-examined.  The patient's previous History and Physical has been reviewed and is unchanged.  There is no change in the plan of care. We plan to proceed with the scheduled procedure.  Ruston Fedora, MD  10/19/2015, 1:56 PM

## 2015-10-19 NOTE — Transfer of Care (Signed)
Immediate Anesthesia Transfer of Care Note  Patient: Mike Booker  Procedure(s) Performed: Procedure(s): INSERTION OF ARTERIOVENOUS (AV) GORE-TEX GRAFT ARM (Left)  Patient Location: PACU  Anesthesia Type:General  Level of Consciousness: awake and patient cooperative  Airway & Oxygen Therapy: Patient Spontanous Breathing and Patient connected to face mask oxygen  Post-op Assessment: Report given to RN and Post -op Vital signs reviewed and stable  Post vital signs: Reviewed and stable  Last Vitals:  Filed Vitals:   10/19/15 1719  BP: 106/37  Pulse: 70  Temp: 97.61F  Resp: 16    Complications: No apparent anesthesia complications

## 2015-10-19 NOTE — Op Note (Signed)
[facility]   OPERATIVE NOTE   PROCEDURE: Left brachiocephalic arteriovenous fistula placement  PRE-OPERATIVE DIAGNOSIS: 1.  ESRD      2. cirrhosis  POST-OPERATIVE DIAGNOSIS: 1. ESRD     2. cirrhosis  SURGEON: Festus BarrenJason Yvonne Stopher, MD  ASSISTANT(S): Raul DelKim Stegmayer, PA-C  ANESTHESIA: general  ESTIMATED BLOOD LOSS: minimal  FINDING(S): Adequate cephalic vein for fistula creation  SPECIMEN(S):  none  INDICATIONS:   Mike Booker is a 79 y.o. male who presents with renal failure in need of pemanent dialysis acces.  The patient is scheduled for left arm AV access placement.  The patient is aware the risks include but are not limited to: bleeding, infection, steal syndrome, nerve damage, ischemic monomelic neuropathy, failure to mature, and need for additional procedures.  The patient is aware of the risks of the procedure and elects to proceed forward.  DESCRIPTION: After full informed written consent was obtained from the patient, the patient was brought back to the operating room and placed supine upon the operating table.  Prior to induction, the patient received IV antibiotics.   After obtaining adequate anesthesia, the patient was then prepped and draped in the standard fashion for a left arm access procedure. Extreme care was necessary due to the poor tensile strength of all of his tissues. This complicated the dissection and the performance of the procedure significantly. I made a curvilinear incision at the level of the antecubital fossa and dissected through the subcutaneous tissue and fascia to gain exposure of the brachial artery.  There were actually 2 arteries at the antecubital fossa already consistent with a high brachial bifurcation. I selected the larger of the 2 arteries which was a little deeper. It was not entirely clear if this was the ulnar or radial artery.  This larger artery was dissected out proximally and distally and prepared for control with vessel  loops .  I then dissected out the cephalic vein.  This was noted to be patent and adequate in size for fistula creation.  I then gave the patient 2500 units of intravenous heparin.  The vein was marked for orientation and the distal segment of the vein was ligated with a  2-0 silk, and the vein was transected. The vein easily passed to, 2.5, and 3 mm dilators. I then instilled the heparinized saline into the vein and clamped it.  At this point, I reset my exposure of the brachial artery and pulled up control on the vessel loops.  I made an arteriotomy with a #11 blade, and then I extended the arteriotomy with a Potts scissor.  I injected heparinized saline proximal and distal to this arteriotomy.  The vein was then sewn to the artery in an end-to-side configuration with a running stitch of 6-0 Prolene.  Prior to completing this anastomosis, I allowed the vein and artery to backbleed.  There was no evidence of clot from any vessels.  I completed the anastomosis in the usual fashion and then released all vessel loops and clamps. A single 6-0 Prolene patch suture was used for hemostasis. There was a palpable  thrill in the venous outflow, and there was a palpable pulse in the artery distal to the anastomosis.  At this point, I irrigated out the surgical wound.  Surgicel was placed. There was no further active bleeding.  The subcutaneous tissue was reapproximated with a running stitch of 3-0 Vicryl.  The skin was then closed with a 4-0 Monocryl suture.  The skin was then cleaned,  dried, and reinforced with Dermabond.  The patient tolerated this procedure well and was taken to the recovery room in stable condition  COMPLICATIONS: None  CONDITION: Stable   Mike Booker    10/19/2015, 4:54 PM

## 2015-10-19 NOTE — Progress Notes (Signed)
Patient arrived to [facility] with large skin tear to right lower forearm, stated it occurred while transferring into hospital building. Measurements 5.5cm x 2.5cm, non adherent dressing applied and wrapped with gauze for protection. Will notify dr. Wyn Quakerew upon his arrival to assess patient.

## 2015-10-20 ENCOUNTER — Encounter: Payer: Self-pay | Admitting: Vascular Surgery

## 2015-10-22 NOTE — Anesthesia Postprocedure Evaluation (Signed)
  Anesthesia Post-op Note  Patient: Mike FarrHerman Booker  Procedure(s) Performed: Procedure(s): INSERTION OF ARTERIOVENOUS (AV) GORE-TEX GRAFT ARM (Left)  Anesthesia type:General  Patient location: PACU  Post pain: Pain level controlled  Post assessment: Post-op Vital signs reviewed, Patient's Cardiovascular Status Stable, Respiratory Function Stable, Patent Airway and No signs of Nausea or vomiting  Post vital signs: Reviewed and stable  Last Vitals:  Filed Vitals:   10/19/15 1821  BP: 151/3  Pulse: 81  Temp:   Resp:     Level of consciousness: awake, alert  and patient cooperative  Complications: No apparent anesthesia complications

## 2015-10-28 ENCOUNTER — Encounter: Payer: Self-pay | Admitting: Family Medicine

## 2015-11-25 ENCOUNTER — Emergency Department: Payer: Medicare Other

## 2015-11-25 ENCOUNTER — Inpatient Hospital Stay
Admission: EM | Admit: 2015-11-25 | Discharge: 2015-11-29 | DRG: 871 | Disposition: A | Discharge status: Hospice | Payer: Medicare Other | Attending: Internal Medicine | Admitting: Internal Medicine

## 2015-11-25 DIAGNOSIS — R6521 Severe sepsis with septic shock: Secondary | ICD-10-CM | POA: Diagnosis present

## 2015-11-25 DIAGNOSIS — G9341 Metabolic encephalopathy: Secondary | ICD-10-CM | POA: Diagnosis present

## 2015-11-25 DIAGNOSIS — K7030 Alcoholic cirrhosis of liver without ascites: Secondary | ICD-10-CM | POA: Diagnosis present

## 2015-11-25 DIAGNOSIS — Z992 Dependence on renal dialysis: Secondary | ICD-10-CM

## 2015-11-25 DIAGNOSIS — A4189 Other specified sepsis: Secondary | ICD-10-CM | POA: Diagnosis present

## 2015-11-25 DIAGNOSIS — I517 Cardiomegaly: Secondary | ICD-10-CM | POA: Diagnosis present

## 2015-11-25 DIAGNOSIS — E871 Hypo-osmolality and hyponatremia: Secondary | ICD-10-CM | POA: Diagnosis present

## 2015-11-25 DIAGNOSIS — Z79899 Other long term (current) drug therapy: Secondary | ICD-10-CM | POA: Diagnosis not present

## 2015-11-25 DIAGNOSIS — R778 Other specified abnormalities of plasma proteins: Secondary | ICD-10-CM

## 2015-11-25 DIAGNOSIS — F10231 Alcohol dependence with withdrawal delirium: Secondary | ICD-10-CM | POA: Diagnosis not present

## 2015-11-25 DIAGNOSIS — A047 Enterocolitis due to Clostridium difficile: Secondary | ICD-10-CM | POA: Diagnosis present

## 2015-11-25 DIAGNOSIS — J189 Pneumonia, unspecified organism: Secondary | ICD-10-CM | POA: Diagnosis present

## 2015-11-25 DIAGNOSIS — A419 Sepsis, unspecified organism: Secondary | ICD-10-CM | POA: Diagnosis present

## 2015-11-25 DIAGNOSIS — N189 Chronic kidney disease, unspecified: Secondary | ICD-10-CM

## 2015-11-25 DIAGNOSIS — Z515 Encounter for palliative care: Secondary | ICD-10-CM | POA: Diagnosis present

## 2015-11-25 DIAGNOSIS — Z66 Do not resuscitate: Secondary | ICD-10-CM | POA: Diagnosis present

## 2015-11-25 DIAGNOSIS — E875 Hyperkalemia: Secondary | ICD-10-CM | POA: Diagnosis not present

## 2015-11-25 DIAGNOSIS — E119 Type 2 diabetes mellitus without complications: Secondary | ICD-10-CM | POA: Diagnosis present

## 2015-11-25 DIAGNOSIS — R7989 Other specified abnormal findings of blood chemistry: Secondary | ICD-10-CM

## 2015-11-25 DIAGNOSIS — N390 Urinary tract infection, site not specified: Secondary | ICD-10-CM | POA: Diagnosis present

## 2015-11-25 DIAGNOSIS — Z720 Tobacco use: Secondary | ICD-10-CM

## 2015-11-25 DIAGNOSIS — E872 Acidosis: Secondary | ICD-10-CM | POA: Diagnosis present

## 2015-11-25 DIAGNOSIS — K767 Hepatorenal syndrome: Secondary | ICD-10-CM | POA: Diagnosis not present

## 2015-11-25 DIAGNOSIS — E876 Hypokalemia: Secondary | ICD-10-CM | POA: Diagnosis present

## 2015-11-25 DIAGNOSIS — Z789 Other specified health status: Secondary | ICD-10-CM | POA: Diagnosis not present

## 2015-11-25 DIAGNOSIS — J9811 Atelectasis: Secondary | ICD-10-CM | POA: Diagnosis present

## 2015-11-25 DIAGNOSIS — E869 Volume depletion, unspecified: Secondary | ICD-10-CM | POA: Diagnosis present

## 2015-11-25 DIAGNOSIS — N186 End stage renal disease: Secondary | ICD-10-CM | POA: Diagnosis not present

## 2015-11-25 DIAGNOSIS — E8809 Other disorders of plasma-protein metabolism, not elsewhere classified: Secondary | ICD-10-CM | POA: Diagnosis present

## 2015-11-25 DIAGNOSIS — D61818 Other pancytopenia: Secondary | ICD-10-CM | POA: Diagnosis present

## 2015-11-25 DIAGNOSIS — Z4659 Encounter for fitting and adjustment of other gastrointestinal appliance and device: Secondary | ICD-10-CM

## 2015-11-25 DIAGNOSIS — D631 Anemia in chronic kidney disease: Secondary | ICD-10-CM | POA: Diagnosis present

## 2015-11-25 DIAGNOSIS — I959 Hypotension, unspecified: Secondary | ICD-10-CM | POA: Diagnosis not present

## 2015-11-25 DIAGNOSIS — F10239 Alcohol dependence with withdrawal, unspecified: Secondary | ICD-10-CM | POA: Diagnosis not present

## 2015-11-25 DIAGNOSIS — J90 Pleural effusion, not elsewhere classified: Secondary | ICD-10-CM | POA: Diagnosis present

## 2015-11-25 DIAGNOSIS — I120 Hypertensive chronic kidney disease with stage 5 chronic kidney disease or end stage renal disease: Secondary | ICD-10-CM | POA: Diagnosis present

## 2015-11-25 DIAGNOSIS — G9340 Encephalopathy, unspecified: Secondary | ICD-10-CM | POA: Diagnosis not present

## 2015-11-25 LAB — URINALYSIS COMPLETE WITH MICROSCOPIC (ARMC ONLY)
Glucose, UA: NEGATIVE mg/dL
Ketones, ur: NEGATIVE mg/dL
NITRITE: NEGATIVE
PH: 5 (ref 5.0–8.0)
PROTEIN: 30 mg/dL — AB
Specific Gravity, Urine: 1.017 (ref 1.005–1.030)

## 2015-11-25 LAB — CBC WITH DIFFERENTIAL/PLATELET
BASOS ABS: 0 10*3/uL (ref 0–0.1)
Basophils Relative: 0 %
EOS ABS: 0 10*3/uL (ref 0–0.7)
Eosinophils Relative: 0 %
HCT: 28 % — ABNORMAL LOW (ref 40.0–52.0)
HEMOGLOBIN: 9.5 g/dL — AB (ref 13.0–18.0)
LYMPHS ABS: 0.4 10*3/uL — AB (ref 1.0–3.6)
MCH: 37.4 pg — AB (ref 26.0–34.0)
MCHC: 34 g/dL (ref 32.0–36.0)
MCV: 110.1 fL — ABNORMAL HIGH (ref 80.0–100.0)
Monocytes Absolute: 0.2 10*3/uL (ref 0.2–1.0)
Monocytes Relative: 4 %
Neutro Abs: 3.3 10*3/uL (ref 1.4–6.5)
PLATELETS: 89 10*3/uL — AB (ref 150–440)
RBC: 2.54 MIL/uL — AB (ref 4.40–5.90)
RDW: 18.2 % — AB (ref 11.5–14.5)
WBC: 3.9 10*3/uL (ref 3.8–10.6)

## 2015-11-25 LAB — TROPONIN I
TROPONIN I: 0.04 ng/mL — AB (ref <0.031)
TROPONIN I: 0.04 ng/mL — AB (ref <0.031)

## 2015-11-25 LAB — COMPREHENSIVE METABOLIC PANEL
ALT: 65 U/L — ABNORMAL HIGH (ref 17–63)
ANION GAP: 8 (ref 5–15)
AST: 251 U/L — ABNORMAL HIGH (ref 15–41)
Albumin: 2 g/dL — ABNORMAL LOW (ref 3.5–5.0)
Alkaline Phosphatase: 217 U/L — ABNORMAL HIGH (ref 38–126)
BUN: 10 mg/dL (ref 6–20)
CALCIUM: 8.2 mg/dL — AB (ref 8.9–10.3)
CHLORIDE: 100 mmol/L — AB (ref 101–111)
CO2: 24 mmol/L (ref 22–32)
Creatinine, Ser: 2.06 mg/dL — ABNORMAL HIGH (ref 0.61–1.24)
GFR calc non Af Amer: 29 mL/min — ABNORMAL LOW (ref >60)
GFR, EST AFRICAN AMERICAN: 34 mL/min — AB (ref >60)
Glucose, Bld: 158 mg/dL — ABNORMAL HIGH (ref 65–99)
Potassium: 2.4 mmol/L — CL (ref 3.5–5.1)
SODIUM: 132 mmol/L — AB (ref 135–145)
Total Bilirubin: 4.1 mg/dL — ABNORMAL HIGH (ref 0.3–1.2)
Total Protein: 5.5 g/dL — ABNORMAL LOW (ref 6.5–8.1)

## 2015-11-25 LAB — AMMONIA: Ammonia: 54 umol/L — ABNORMAL HIGH (ref 9–35)

## 2015-11-25 LAB — GLUCOSE, CAPILLARY: Glucose-Capillary: 165 mg/dL — ABNORMAL HIGH (ref 65–99)

## 2015-11-25 LAB — MRSA PCR SCREENING: MRSA by PCR: NEGATIVE

## 2015-11-25 LAB — POTASSIUM: POTASSIUM: 2.4 mmol/L — AB (ref 3.5–5.1)

## 2015-11-25 LAB — LACTIC ACID, PLASMA
LACTIC ACID, VENOUS: 3.8 mmol/L — AB (ref 0.5–2.0)
LACTIC ACID, VENOUS: 3 mmol/L — AB (ref 0.5–2.0)

## 2015-11-25 MED ORDER — VANCOMYCIN HCL IN DEXTROSE 750-5 MG/150ML-% IV SOLN
750.0000 mg | INTRAVENOUS | Status: DC
  Administered 2015-11-27: 750 mg via INTRAVENOUS
  Filled 2015-11-25: qty 150

## 2015-11-25 MED ORDER — AZITHROMYCIN 250 MG PO TABS
500.0000 mg | ORAL_TABLET | Freq: Every day | ORAL | Status: AC
  Administered 2015-11-25: 500 mg via ORAL
  Filled 2015-11-25: qty 2

## 2015-11-25 MED ORDER — SODIUM CHLORIDE 0.9 % IV SOLN
1500.0000 mg | Freq: Once | INTRAVENOUS | Status: AC
  Administered 2015-11-25: 1500 mg via INTRAVENOUS
  Filled 2015-11-25: qty 1500

## 2015-11-25 MED ORDER — SODIUM CHLORIDE 0.9 % IJ SOLN
3.0000 mL | Freq: Two times a day (BID) | INTRAMUSCULAR | Status: DC
  Administered 2015-11-26 – 2015-11-29 (×4): 3 mL via INTRAVENOUS

## 2015-11-25 MED ORDER — POTASSIUM CHLORIDE IN NACL 20-0.9 MEQ/L-% IV SOLN
INTRAVENOUS | Status: DC
  Administered 2015-11-25: 23:00:00 via INTRAVENOUS
  Filled 2015-11-25 (×3): qty 1000

## 2015-11-25 MED ORDER — ACETAMINOPHEN 650 MG RE SUPP: 650.0000 mg | Freq: Four times a day (QID) | RECTAL | Status: DC | PRN

## 2015-11-25 MED ORDER — SODIUM CHLORIDE 0.9 % IV BOLUS (SEPSIS)
1000.0000 mL | INTRAVENOUS | Status: AC
  Administered 2015-11-25 (×2): 1000 mL via INTRAVENOUS

## 2015-11-25 MED ORDER — AZITHROMYCIN 250 MG PO TABS
250.0000 mg | ORAL_TABLET | Freq: Every day | ORAL | Status: DC
  Administered 2015-11-26 – 2015-11-27 (×2): 250 mg via ORAL
  Filled 2015-11-25 (×2): qty 1

## 2015-11-25 MED ORDER — HEPARIN SODIUM (PORCINE) 5000 UNIT/ML IJ SOLN
5000.0000 [IU] | Freq: Three times a day (TID) | INTRAMUSCULAR | Status: DC
  Administered 2015-11-25 – 2015-11-29 (×11): 5000 [IU] via SUBCUTANEOUS
  Filled 2015-11-25 (×11): qty 1

## 2015-11-25 MED ORDER — SODIUM CHLORIDE 0.9 % IV BOLUS (SEPSIS)
500.0000 mL | INTRAVENOUS | Status: AC
  Administered 2015-11-25: 500 mL via INTRAVENOUS

## 2015-11-25 MED ORDER — LOPERAMIDE HCL 2 MG PO CAPS: 2.0000 mg | ORAL_CAPSULE | ORAL | Status: DC | PRN

## 2015-11-25 MED ORDER — PIPERACILLIN-TAZOBACTAM 3.375 G IVPB 30 MIN
3.3750 g | Freq: Once | INTRAVENOUS | Status: AC
  Administered 2015-11-25: 3.375 g via INTRAVENOUS
  Filled 2015-11-25: qty 50

## 2015-11-25 MED ORDER — PIPERACILLIN-TAZOBACTAM 3.375 G IVPB 30 MIN: 3.3750 g | Freq: Once | INTRAVENOUS | Status: DC

## 2015-11-25 MED ORDER — PIPERACILLIN-TAZOBACTAM 3.375 G IVPB
3.3750 g | Freq: Two times a day (BID) | INTRAVENOUS | Status: DC
  Filled 2015-11-25 (×2): qty 50

## 2015-11-25 MED ORDER — RIFAXIMIN 200 MG PO TABS
200.0000 mg | ORAL_TABLET | Freq: Two times a day (BID) | ORAL | Status: DC
  Administered 2015-11-25 – 2015-11-27 (×5): 200 mg via ORAL
  Filled 2015-11-25 (×6): qty 1

## 2015-11-25 MED ORDER — VANCOMYCIN HCL IN DEXTROSE 1-5 GM/200ML-% IV SOLN
1000.0000 mg | Freq: Once | INTRAVENOUS | Status: DC
  Filled 2015-11-25: qty 200

## 2015-11-25 MED ORDER — PHENYLEPHRINE HCL 10 MG/ML IJ SOLN
30.0000 ug/min | INTRAVENOUS | Status: DC
  Administered 2015-11-25: 30 ug/min via INTRAVENOUS
  Administered 2015-11-26: 100 ug/min via INTRAVENOUS
  Filled 2015-11-25 (×4): qty 1

## 2015-11-25 MED ORDER — ACETAMINOPHEN 500 MG PO TABS
1000.0000 mg | ORAL_TABLET | Freq: Once | ORAL | Status: AC
  Administered 2015-11-25: 1000 mg via ORAL
  Filled 2015-11-25: qty 2

## 2015-11-25 MED ORDER — ADULT MULTIVITAMIN W/MINERALS CH
1.0000 | ORAL_TABLET | Freq: Every day | ORAL | Status: DC
  Administered 2015-11-26 – 2015-11-28 (×3): 1 via ORAL
  Filled 2015-11-25 (×3): qty 1

## 2015-11-25 MED ORDER — ACETAMINOPHEN 650 MG RE SUPP
650.0000 mg | Freq: Once | RECTAL | Status: DC
  Filled 2015-11-25: qty 1

## 2015-11-25 MED ORDER — MIRTAZAPINE 15 MG PO TABS
15.0000 mg | ORAL_TABLET | Freq: Every day | ORAL | Status: DC
  Administered 2015-11-25 – 2015-11-28 (×4): 15 mg via ORAL
  Filled 2015-11-25 (×4): qty 1

## 2015-11-25 MED ORDER — ACETAMINOPHEN 325 MG PO TABS: 650.0000 mg | ORAL_TABLET | Freq: Four times a day (QID) | ORAL | Status: DC | PRN

## 2015-11-25 MED ORDER — PANTOPRAZOLE SODIUM 40 MG PO TBEC
40.0000 mg | DELAYED_RELEASE_TABLET | Freq: Every day | ORAL | Status: DC
  Administered 2015-11-26 – 2015-11-29 (×3): 40 mg via ORAL
  Filled 2015-11-25 (×3): qty 1

## 2015-11-25 MED ORDER — POTASSIUM CHLORIDE 20 MEQ PO PACK
40.0000 meq | PACK | Freq: Two times a day (BID) | ORAL | Status: DC
  Administered 2015-11-25 – 2015-11-27 (×5): 40 meq via ORAL
  Filled 2015-11-25 (×5): qty 2

## 2015-11-25 NOTE — ED Notes (Signed)
Critical lab lactic acid 3.8. MD aware.

## 2015-11-25 NOTE — ED Provider Notes (Addendum)
[facility] Emergency Department Provider Note     Time seen: ----------------------------------------- 6:18 PM on 11/25/2015 -----------------------------------------  L5 caveat: Review of systems and history is limited by altered mental status  I have reviewed the triage vital signs and the nursing notes.   HISTORY  Chief Complaint Altered Mental Status    HPI Mike Booker is a 79 y.o. male brought to ER by EMS for fever and altered mental status. Y told EMS he started having bouts of confusion last night and described his urine is similar to gasoline. Patient had temperature 100.8, was found be tachycardic by EMS. Recently started dialysis about a month ago, patient is unable to give review of systems or report.   Past Medical History  Diagnosis Date  . Alcoholic cirrhosis (HCC)   . Thrombocytopenia (HCC)   . High serum ferritin   . Hepatorenal syndrome (HCC)     Secondary  . IDA (iron deficiency anemia) 05/24/2015  . Hypertension   . Diabetes mellitus without complication (HCC)     no longer diabetic since 3 yr ago    Patient Active Problem List   Diagnosis Date Noted  . Protein-calorie malnutrition, severe 10/12/2015  . Renal failure 10/11/2015  . Pancytopenia (HCC) 08/23/2015  . Elevated bilirubin 08/23/2015  . IDA (iron deficiency anemia) 05/24/2015  . Diabetes mellitus, type 2 (HCC) 05/01/2015  . Bone marrow failure (HCC) 05/01/2015  . ALC (alcoholic liver cirrhosis) (HCC) 05/01/2015  . Chronic kidney disease (CKD), stage IV (severe) (HCC) 01/25/2015  . AA (alcohol abuse) 01/25/2015    Past Surgical History  Procedure Laterality Date  . Tonsillectomy    . Fracture surgery  left ankle    still has screws  . Peripheral vascular catheterization N/A 10/12/2015    Procedure: Dialysis/Perma Catheter Insertion;  Surgeon: Annice Needy, MD;  Location: [facility] INVASIVE CV LAB;  Service: Cardiovascular;  Laterality: N/A;  . Av fistula  placement Left 10/19/2015    Procedure: INSERTION OF ARTERIOVENOUS (AV) GORE-TEX GRAFT ARM;  Surgeon: Annice Needy, MD;  Location: [facility] ORS;  Service: Vascular;  Laterality: Left;    Allergies Review of patient's allergies indicates no known allergies.  Social History Social History  Substance Use Topics  . Smoking status: Former Smoker    Types: Cigarettes    Quit date: 12/16/1965  . Smokeless tobacco: Current User    Types: Chew  . Alcohol Use: Yes     Comment: 2 "heavy" drinks per day/    Review of Systems Unknown, positive for fever, positive for altered mental status. ____________________________________________   PHYSICAL EXAM:  VITAL SIGNS: ED Triage Vitals  Enc Vitals Group     BP --      Pulse --      Resp --      Temp --      Temp src --      SpO2 11/25/15 1814 97 %     Weight --      Height --      Head Cir --      Peak Flow --      Pain Score --      Pain Loc --      Pain Edu? --      Excl. in GC? --     Constitutional: Alert but disoriented, ill-appearing. Mild to moderate distress Eyes: Conjunctivae are pale. PERRL. Normal extraocular movements. ENT   Head: Normocephalic and atraumatic.   Nose: No congestion/rhinnorhea.   Mouth/Throat: Mucous  membranes are moist.   Neck: No stridor. Cardiovascular: Rapid rate, irregular rhythm. Normal and symmetric distal pulses are present in all extremities.  Respiratory: Normal respiratory effort without tachypnea nor retractions. Breath sounds are clear and equal bilaterally.  Gastrointestinal: Soft and nontender. No distention. Normal bowel sounds. Musculoskeletal: Nontender with normal range of motion in all extremities.  No lower extremity tenderness nor edema. Pain elicited with range of motion of his upper extremity. Neurologic:  Confused, moving his extremities well. No gross focal neurologic deficits are appreciated.  Skin:  Skin is warm, dry and intact. Pallor is  noted ____________________________________________  EKG: Interpreted by me. Sinus tachycardia with a rate of 122 bpm, PVCs, right bundle branch block, diffuse ST and T-wave changes.  ____________________________________________  ED COURSE:  Pertinent labs & imaging results that were available during my care of the patient were reviewed by me and considered in my medical decision making (see chart for details). Patient febrile, tachycardic, borderline hypotensive. Sepsis protocols be initiated. ____________________________________________    LABS (pertinent positives/negatives)  Labs Reviewed  COMPREHENSIVE METABOLIC PANEL - Abnormal; Notable for the following:    Sodium 132 (*)    Potassium 2.4 (*)    Chloride 100 (*)    Glucose, Bld 158 (*)    Creatinine, Ser 2.06 (*)    Calcium 8.2 (*)    Total Protein 5.5 (*)    Albumin 2.0 (*)    AST 251 (*)    ALT 65 (*)    Alkaline Phosphatase 217 (*)    Total Bilirubin 4.1 (*)    GFR calc non Af Amer 29 (*)    GFR calc Af Amer 34 (*)    All other components within normal limits  TROPONIN I - Abnormal; Notable for the following:    Troponin I 0.04 (*)    All other components within normal limits  CBC WITH DIFFERENTIAL/PLATELET - Abnormal; Notable for the following:    RBC 2.54 (*)    Hemoglobin 9.5 (*)    HCT 28.0 (*)    MCV 110.1 (*)    MCH 37.4 (*)    RDW 18.2 (*)    Platelets 89 (*)    Lymphs Abs 0.4 (*)    All other components within normal limits  URINALYSIS COMPLETEWITH MICROSCOPIC ([facility] ONLY) - Abnormal; Notable for the following:    Color, Urine AMBER (*)    APPearance CLOUDY (*)    Bilirubin Urine 1+ (*)    Hgb urine dipstick 2+ (*)    Protein, ur 30 (*)    Leukocytes, UA 2+ (*)    Bacteria, UA MANY (*)    Squamous Epithelial / LPF 0-5 (*)    All other components within normal limits  BLOOD GAS, VENOUS - Abnormal; Notable for the following:    pH, Ven 7.48 (*)    pCO2, Ven 36 (*)    Acid-Base Excess 3.4 (*)     All other components within normal limits  AMMONIA - Abnormal; Notable for the following:    Ammonia 54 (*)    All other components within normal limits  CULTURE, BLOOD (ROUTINE X 2)  CULTURE, BLOOD (ROUTINE X 2)  URINE CULTURE  LACTIC ACID, PLASMA  LACTIC ACID, PLASMA   CRITICAL CARE Performed by: Emily Filbert   Total critical care time: 30 minutes  Critical care time was exclusive of separately billable procedures and treating other patients.  Critical care was necessary to treat or prevent imminent or life-threatening deterioration.  Critical care was time spent personally by me on the following activities: development of treatment plan with patient and/or surrogate as well as nursing, discussions with consultants, evaluation of patient's response to treatment, examination of patient, obtaining history from patient or surrogate, ordering and performing treatments and interventions, ordering and review of laboratory studies, ordering and review of radiographic studies, pulse oximetry and re-evaluation of patient's condition.   RADIOLOGY Images were viewed by me  Chest x-ray IMPRESSION: 1. Dense left lower lobe atelectasis and/or pneumonia. 2. Marked cardiomegaly without evidence of pulmonary edema. 3. Bilateral pleural effusions suspected. ____________________________________________  FINAL ASSESSMENT AND PLAN  Fever, altered mental status, sepsis, urinary tract infection, possible pneumonia hypokalemia  Plan: Patient with labs and imaging as dictated above. Patient presents likely with urosepsis. There is also possibility of left lower lobe pneumonia and I have reviewed his x-ray myself. Wife states he does not want pressors or intubation. We'll continue fluids and IV antibiotics. MAXIMUM TEMPERATURE here is 104 rectally, he was given oral Tylenol for fever and oral potassium for hypokalemia. Likely need to be changed to IV fluids that contain potassium. Currently  heart rate and blood pressure improved, mean arterial pressure 72. He appears to be responding to IV fluid bolus.    Emily FilbertJonathan E Williams, MD 11/25/15 1910  Emily FilbertJonathan E Williams, MD 11/25/15 [phone]

## 2015-11-25 NOTE — ED Notes (Signed)
Critical lab value potassium 2.4. MD notified.

## 2015-11-25 NOTE — Progress Notes (Signed)
ANTIBIOTIC CONSULT NOTE - INITIAL  Pharmacy Consult for Vancomycin & Zosyn Indication: rule out pneumonia and rule out sepsis  No Known Allergies  Patient Measurements: Height: 5\' 8"  (172.7 cm) Weight: 163 lb 9.3 oz (74.2 kg) IBW/kg (Calculated) : 68.4   Vital Signs: Temp: 104 F (40 C) (12/10 1816) Temp Source: Rectal (12/10 1816) BP: 111/47 mmHg (12/10 2051) Pulse Rate: 129 (12/10 2051) Intake/Output from previous day:   Intake/Output from this shift:    Labs:  Recent Labs  11/25/15 1819  WBC 3.9  HGB 9.5*  PLT 89*  CREATININE 2.06*   Estimated Creatinine Clearance: 29.1 mL/min (by C-G formula based on Cr of 2.06). No results for input(s): VANCOTROUGH, VANCOPEAK, VANCORANDOM, GENTTROUGH, GENTPEAK, GENTRANDOM, TOBRATROUGH, TOBRAPEAK, TOBRARND, AMIKACINPEAK, AMIKACINTROU, AMIKACIN in the last 72 hours.   Microbiology: No results found for this or any previous visit (from the past 720 hour(s)).  Medical History: Past Medical History  Diagnosis Date  . Alcoholic cirrhosis (HCC)   . Thrombocytopenia (HCC)   . High serum ferritin   . Hepatorenal syndrome (HCC)     Secondary  . IDA (iron deficiency anemia) 05/24/2015  . Hypertension   . Diabetes mellitus without complication (HCC)     no longer diabetic since 3 yr ago    Medications:  Anti-infectives    Start     Dose/Rate Route Frequency Ordered Stop   11/27/15 1200  vancomycin (VANCOCIN) IVPB 750 mg/150 ml premix     750 mg 150 mL/hr over 60 Minutes Intravenous Every M-W-F (Hemodialysis) 11/25/15 1958     11/26/15 1000  azithromycin (ZITHROMAX) tablet 250 mg     250 mg Oral Daily 11/25/15 2005 11/30/15 0959   11/26/15 0700  piperacillin-tazobactam (ZOSYN) IVPB 3.375 g     3.375 g 12.5 mL/hr over 240 Minutes Intravenous Every 12 hours 11/25/15 1958     11/25/15 2200  rifaximin (XIFAXAN) tablet 200 mg     200 mg Oral 2 times daily 11/25/15 2005     11/25/15 2015  azithromycin (ZITHROMAX) tablet 500 mg     500 mg Oral Daily 11/25/15 2005 11/25/15 2049   11/25/15 1930  vancomycin (VANCOCIN) 1,500 mg in sodium chloride 0.9 % 500 mL IVPB     1,500 mg 250 mL/hr over 120 Minutes Intravenous  Once 11/25/15 1930     11/25/15 1900  piperacillin-tazobactam (ZOSYN) IVPB 3.375 g  Status:  Discontinued     3.375 g 100 mL/hr over 30 Minutes Intravenous  Once 11/25/15 1857 11/25/15 1857   11/25/15 1830  piperacillin-tazobactam (ZOSYN) IVPB 3.375 g     3.375 g 100 mL/hr over 30 Minutes Intravenous  Once 11/25/15 1817 11/25/15 2021   11/25/15 1830  vancomycin (VANCOCIN) IVPB 1000 mg/200 mL premix  Status:  Discontinued     1,000 mg 200 mL/hr over 60 Minutes Intravenous  Once 11/25/15 1817 11/25/15 1929     Assessment: 79 yo male admitted with cough, weakness and confusion.  Suspect Left Lower lobe pneumonia.   Currently on dialysis M-W-F  Goal of Therapy:  Vancomycin trough level 15-20 mcg/ml  Plan:  Loading Dose of Vancomycin 1500mg  tonight.  Followed by Vancomycin 750mg  after each Dialysis session (currently MWF).  Zosyn 3.375mg  EI q12 hours  Jack Mineau K 11/25/2015,9:10 PM

## 2015-11-25 NOTE — ED Notes (Signed)
Pt arrived via EMS from home.  Wife told EMS that he started having bouts of confusion last night and described his urine as similar to gasoline. Pt had temp of 100.8 with EMS and tachycardic.  Recently started dialysis about a month ago. Skin warm to the touch.

## 2015-11-25 NOTE — H&P (Addendum)
[facility] [place] at [facility]   PATIENT NAME: Mike Booker    MR#:  [id number]  DATE OF BIRTH:  [birth year]  DATE OF ADMISSION:  11/25/2015  PRIMARY CARE PHYSICIAN: Clydie Braun, MD   REQUESTING/REFERRING PHYSICIAN:   CHIEF COMPLAINT:   Chief Complaint  Patient presents with  . Altered Mental Status    HISTORY OF PRESENT ILLNESS: Mike Booker  is a 79 y.o. male with a known history of liver cirrhosis, end-stage renal disease, status post AV fistula formation about a month ago in the left upper extremity, being continued on hemodialysis via right upper chest PermCath. This presents to the hospital with complaints of cough and and weakness as well as confusion. According to patient's family, patient has been declining since at least a few weeks ago. He was noted to have significant weakness as well as confusion earlier today. He has been coughing with dry cough. No phlegm production. He's been intermittently having loose stools and he was noted to have unpleasant urine smell and was brought to emergency room for further evaluation. In emergency room, he was noted to be hypotensive, tachycardic, he was febrile with temperature of 104 rectally, labs revealed hypokalemia with potassium level of 2.4. Elevated total bilirubin as well as elevated ammonia level elevated troponin and lactic acid level of 3.8. Urinalysis revealed pyuria. Patient chest x-ray showed left lower lobe pneumonia. Hospitalist services were contacted for admission  PAST MEDICAL HISTORY:   Past Medical History  Diagnosis Date  . Alcoholic cirrhosis (HCC)   . Thrombocytopenia (HCC)   . High serum ferritin   . Hepatorenal syndrome (HCC)     Secondary  . IDA (iron deficiency anemia) 05/24/2015  . Hypertension   . Diabetes mellitus without complication (HCC)     no longer diabetic since 3 yr ago    PAST SURGICAL HISTORY:  Past Surgical History  Procedure Laterality Date  .  Tonsillectomy    . Fracture surgery  left ankle    still has screws  . Peripheral vascular catheterization N/A 10/12/2015    Procedure: Dialysis/Perma Catheter Insertion;  Surgeon: Annice Needy, MD;  Location: [facility] INVASIVE CV LAB;  Service: Cardiovascular;  Laterality: N/A;  . Av fistula placement Left 10/19/2015    Procedure: INSERTION OF ARTERIOVENOUS (AV) GORE-TEX GRAFT ARM;  Surgeon: Annice Needy, MD;  Location: [facility] ORS;  Service: Vascular;  Laterality: Left;    SOCIAL HISTORY:  Social History  Substance Use Topics  . Smoking status: Former Smoker    Types: Cigarettes    Quit date: 12/16/1965  . Smokeless tobacco: Current User    Types: Chew  . Alcohol Use: Yes     Comment: 2 "heavy" drinks per day/    FAMILY HISTORY:  Family History  Problem Relation Age of Onset  . Cancer - Colon Mother 90  . Breast cancer Sister 71  . Prostate cancer Other     DRUG ALLERGIES: No Known Allergies  Review of Systems  Unable to perform ROS: mental acuity  Constitutional: Negative for fever, chills, weight loss and malaise/fatigue.  HENT: Negative for congestion.   Eyes: Negative for blurred vision and double vision.  Respiratory: Positive for cough. Negative for sputum production, shortness of breath and wheezing.   Cardiovascular: Negative for chest pain, palpitations, orthopnea, leg swelling and PND.  Gastrointestinal: Positive for diarrhea. Negative for nausea, vomiting, abdominal pain, constipation, blood in stool and melena.  Genitourinary: Negative for dysuria, urgency, frequency and hematuria.  Musculoskeletal: Negative for falls.  Skin: Negative for rash.  Neurological: Negative for dizziness and weakness.  Psychiatric/Behavioral: Negative for depression and memory loss. The patient is not nervous/anxious.     MEDICATIONS AT HOME:  Prior to Admission medications   Medication Sig Start Date End Date Taking? Authorizing Provider  furosemide (LASIX) 20 MG tablet Take 20 mg by  mouth daily.    Yes Historical Provider, MD  loperamide (IMODIUM) 2 MG capsule Take 2 mg by mouth as needed for diarrhea or loose stools.    Yes Historical Provider, MD  mirtazapine (REMERON) 15 MG tablet Take 15 mg by mouth at bedtime. 11/13/15  Yes Historical Provider, MD  Multiple Vitamin (MULTIVITAMIN WITH MINERALS) TABS tablet Take 1 tablet by mouth daily.   Yes Historical Provider, MD  omeprazole (PRILOSEC) 40 MG capsule Take 40 mg by mouth daily.   Yes Historical Provider, MD  HYDROcodone-acetaminophen (NORCO/VICODIN) 5-325 MG tablet Take 1-2 tablets by mouth every 6 (six) hours as needed for moderate pain. 10/19/15   Annice Needy, MD      PHYSICAL EXAMINATION:   VITAL SIGNS: Blood pressure 93/59, pulse 120, temperature 104 F (40 C), temperature source Rectal, resp. rate 15, height  (1.727 m), weight 74.2 kg (163 lb 9.3 oz), SpO2 98 %.  GENERAL:  79 y.o.-year-old patient lying in the bed with no acute distress, somnolent. Pale, somewhat jaundiced laying in the stretcher.  EYES: Pupils equal, round, reactive to light and accommodation. Scleral icterus is noted. Extraocular muscles intact.  HEENT: Head atraumatic, normocephalic. Oropharynx and nasopharynx clear.  NECK:  Supple, no jugular venous distention. No thyroid enlargement, no tenderness.  LUNGS: Normal breath sounds bilaterally, no wheezing, but few basilar rales,rhonchi and crepitations were noted on auscultation. No use of accessory muscles of respiration. Intermittent dry cough CARDIOVASCULAR: S1, S2 , irregular. Systolic 4-6 murmur noted precordially, no rubs, or gallops.  ABDOMEN: Soft, nontender, nondistended. Bowel sounds present. No organomegaly or mass.  EXTREMITIES: No pedal edema, cyanosis, or clubbing. Left upper extremity AV fistula with bruit and thrill. Right upper chest permanent hemodialysis catheter NEUROLOGIC: Cranial nerves II through XII are intact. Muscle strength 5/5 in all extremities. Sensation intact.  Gait not checked.  PSYCHIATRIC: The patient is somnolent ,intermittently confused  SKIN: No obvious rash, lesion, or ulcer.   LABORATORY PANEL:   CBC  Recent Labs Lab 11/25/15 1819  WBC 3.9  HGB 9.5*  HCT 28.0*  PLT 89*  MCV 110.1*  MCH 37.4*  MCHC 34.0  RDW 18.2*  LYMPHSABS 0.4*  MONOABS 0.2  EOSABS 0.0  BASOSABS 0.0   ------------------------------------------------------------------------------------------------------------------  Chemistries   Recent Labs Lab 11/25/15 1819  NA 132*  K 2.4*  CL 100*  CO2 24  GLUCOSE 158*  BUN 10  CREATININE 2.06*  CALCIUM 8.2*  AST 251*  ALT 65*  ALKPHOS 217*  BILITOT 4.1*   ------------------------------------------------------------------------------------------------------------------  Cardiac Enzymes  Recent Labs Lab 11/25/15 1819  TROPONINI 0.04*   ------------------------------------------------------------------------------------------------------------------  RADIOLOGY: Dg Chest Port 1 View  11/25/2015  CLINICAL DATA:  79 year old with acute mental status changes, fever, and urine with a strong smell that began yesterday. Patient began hemodialysis 1 month ago. EXAM: PORTABLE CHEST 1 VIEW COMPARISON:  None. FINDINGS: Right jugular dialysis catheter tips project over the lower SVC. Cardiac silhouette markedly enlarged. Pulmonary vascularity normal without evidence pulmonary edema. Dense airspace consolidation in the left lower lobe. Bilateral pleural effusions suspected. IMPRESSION: 1. Dense left lower lobe atelectasis and/or pneumonia. 2. Marked  cardiomegaly without evidence of pulmonary edema. 3. Bilateral pleural effusions suspected. Electronically Signed   By: Hulan Saashomas  Lawrence M.D.   On: 11/25/2015 18:44    EKG: Orders placed or performed during the hospital encounter of 08/09/15  . ED EKG  . ED EKG  . EKG   cage in emergency room revealed sinus or ectopic atrial tachycardia, rate of 122. His ventricle  premature complexes, right atrial enlargement. T depressions in lateral leads  IMPRESSION AND PLAN:  Active Problems:   Sepsis (HCC) 1. SIRS due to left lower lobe pneumonia. Admit patient to medical floor, get blood cultures as well as sputum cultures if possible, initiate patient on vancomycin and Zosyn intravenously. Following culture results 2. Left lower lobe pneumonia due to unknown infectious agent at present, continue antibiotics, broad-spectrum, get sputum cultures and adjust antibiotics depending on culture results 3. Pyuria, question of a urinary tract infection, getting urinary cultures. Continue broad-spectrum antibiotic therapy 4. Hypotension, likely chronic due to hypoalbuminemia, continue low rate IV fluids following blood pressure readings as well as oxygenation very closely due to concerns of fluid overload 5. Hypokalemia, likely due to intermittent diarrhea, supplement orally as well as intravenously 6. End staging renal disease, get nephrologist involved for further recommendations. Hemodialysis per schedule 7. Hyponatremia, likely due to intravascular volume depletion.follow with IV fluid administration 8. Metabolic encephalopathy, likely due to infection as well as hepatic encephalopathy. Initiate patient on Xifaxan 9. Normal EKG, check cardiac enzymes 3, unable to initiate patient on beta blockers due to hypotension 10. Hypotension, likely chronic due to liver cirrhosis, as well as worsened possibly due to sepsis, low rate IV fluids will be initiated, watching for fluid overload closely in this patient with end-stage renal disease   All the records are reviewed and case discussed with ED provider. Management plans discussed with the patient, family and they are in agreement.  CODE STATUS:    TOTAL  critical careTIME TAKING CARE OF THIS PATIENt 60 minutes.    Katharina CaperVAICKUTE,Soliana Kitko M.D on 11/25/2015 at 7:55 PM  Between 7am to 6pm - Pager - [phone] After 6pm go to  [email or web address] - password [email or web address] [email or web address]  [facility]  Office  [phone]  CC: Primary care physician; Clydie BraunFITZGERALD, DAVID, MD

## 2015-11-25 NOTE — Progress Notes (Signed)
eLink Physician-Brief Progress Note Patient Name: Mike Booker DOB: 06/02/1936 MRN: [id number]   Date of Service  11/25/2015  HPI/Events of Note  79 yo male with PMH of ESRD and ETOH related cirrhosis. Admitted to ICU with fever to 104.0 F and hypotension. Now s/p 2.5 liters of crystalloid. Infectious source felt to be LLL pneumonia vs cystitis/sepsis. Currently on emperic Abx Rx with Vancomycin, Azithromycin Rifaximin and Zosyn. BP currently 68/38. No central venous access. No order at this time for PCCM consult.   eICU Interventions  Will order: 1. Phenylephrine IV infusion. Titrate to MAP >= 65. 2. Consider CVL placement.  2. Bedside nurse to ask Hospitalist on call if they desire to have a PCCM consult.  3. eLink is available to address acute issues as requested.      Intervention Category Evaluation Type: New Patient Evaluation  Mike Booker,Mike Booker 11/25/2015, 10:26 PM

## 2015-11-25 NOTE — Progress Notes (Signed)
eLink notified about Pt's Lactic Acid 3.0

## 2015-11-26 DIAGNOSIS — N186 End stage renal disease: Secondary | ICD-10-CM

## 2015-11-26 DIAGNOSIS — I959 Hypotension, unspecified: Secondary | ICD-10-CM

## 2015-11-26 LAB — BASIC METABOLIC PANEL
ANION GAP: 5 (ref 5–15)
BUN: 12 mg/dL (ref 6–20)
CHLORIDE: 106 mmol/L (ref 101–111)
CO2: 22 mmol/L (ref 22–32)
Calcium: 7.3 mg/dL — ABNORMAL LOW (ref 8.9–10.3)
Creatinine, Ser: 2.26 mg/dL — ABNORMAL HIGH (ref 0.61–1.24)
GFR, EST AFRICAN AMERICAN: 30 mL/min — AB (ref >60)
GFR, EST NON AFRICAN AMERICAN: 26 mL/min — AB (ref >60)
Glucose, Bld: 311 mg/dL — ABNORMAL HIGH (ref 65–99)
POTASSIUM: 3.1 mmol/L — AB (ref 3.5–5.1)
SODIUM: 133 mmol/L — AB (ref 135–145)

## 2015-11-26 LAB — CBC
HEMATOCRIT: 26 % — AB (ref 40.0–52.0)
HEMOGLOBIN: 8.6 g/dL — AB (ref 13.0–18.0)
MCH: 36.7 pg — ABNORMAL HIGH (ref 26.0–34.0)
MCHC: 33 g/dL (ref 32.0–36.0)
MCV: 111.4 fL — AB (ref 80.0–100.0)
Platelets: 100 10*3/uL — ABNORMAL LOW (ref 150–440)
RBC: 2.34 MIL/uL — AB (ref 4.40–5.90)
RDW: 18.2 % — AB (ref 11.5–14.5)
WBC: 12.3 10*3/uL — AB (ref 3.8–10.6)

## 2015-11-26 LAB — LACTIC ACID, PLASMA: Lactic Acid, Venous: 3.1 mmol/L (ref 0.5–2.0)

## 2015-11-26 LAB — TROPONIN I
TROPONIN I: 0.04 ng/mL — AB (ref <0.031)
Troponin I: 0.03 ng/mL (ref <0.031)

## 2015-11-26 MED ORDER — PIPERACILLIN-TAZOBACTAM 3.375 G IVPB
3.3750 g | Freq: Three times a day (TID) | INTRAVENOUS | Status: DC
  Administered 2015-11-26 – 2015-11-27 (×4): 3.375 g via INTRAVENOUS
  Filled 2015-11-26 (×6): qty 50

## 2015-11-26 MED ORDER — DEXTROSE 5 % IV SOLN
30.0000 ug/min | INTRAVENOUS | Status: DC
  Administered 2015-11-26: 80 ug/min via INTRAVENOUS
  Administered 2015-11-26: 100 ug/min via INTRAVENOUS
  Administered 2015-11-27: 80 ug/min via INTRAVENOUS
  Filled 2015-11-26 (×4): qty 4

## 2015-11-26 NOTE — Progress Notes (Signed)
Pt arrived to unit with low BP after receiving 2.5L NS bolus in ED. [facility]Link was notified and Dr. Dellie CatholicSommers was updated on Pt's condition, specifically Pt's low BP and limited IV access. Dr. Dellie CatholicSommers ordered Phenylephrine drip for the Pt's low BP due to no central line access and requested that the admitting MD put in consult orders for critical care management. Dr. Anne HahnWillis was then notified on Pt's condition and new orders to transfer Pt to ICU status and critical care consult were given. Phenylephrine drip was started and titrated per Dr. Dellie CatholicSommers orders. Pt required up to 1700mcg/min of Phenylephrine which was a rate of 13150ml/hr. Given the Pt's history of end-stage renal disease the high rate of fluid caused some concern. Pharmacy was then notified about possibly adjusting the concentration of the Phenylephrine to decrease the rate. Per pharmacist the concentration of the Phenylephrine drip could not be adjusted due to the lack of central line access. [facility] was then notified to discuss options and concerns about the amount of fluid the Pt was receiving and at the time BP was remaining borderline. Dr. Craige CottaSood who had taken over for Dr. Dellie CatholicSommers said that he had already looked over the Pt's chart and discussed with Dr. Ardyth Manam, the intensivist on call, the Pt's condition. He said that Dr. Ardyth Manam was made aware of the Pt's BP and lack of central line access and would possibly be stopping by during the shift to assess the Pt, and then advised to contact nephrology to discuss the possibilities of using Pt's dialysis catheter. Dr. Thedore MinsSingh with nephrology was then notified and explained everything and the current issue of the Pt's low BP, lack of central line access, and increasing rate of flood. He was also informed of the Pt's potassium level and orders to check a STAT potassium were given to assess the necessity of the other fluids running. Dr. Thedore MinsSingh then gave orders to use the dialysis catheter due to the lack of other central line  access to infuse the higher concentration of Phenylephrine. So, with maximum sterile technique one of the ports on the Pt's dialysis catheter was accessed and the new concentration of the Phenylephrine was started. Will continue to monitor Pt

## 2015-11-26 NOTE — Progress Notes (Signed)
Subjective:  Patient presents from home. Doesn't remebber why he is in the hospital. Was brought in by Wife Found to have high grade fever in the ER, rectal temp of 104 Per ER notes, patient had bouts of confusion, foul smelling urine,  diarrhea WBC count is elevated, K is low CXR suspicious for left lower lobe pneumonia (family reports cough at home. Patient denies exessive cough U/A 2+ LE, TNTC WBC Blood cultures are pending   Objective:  Vital signs in last 24 hours:  Temp:  [97.5 F (36.4 C)-104 F (40 C)] 97.5 F (36.4 C) (12/11 0831) Pulse Rate:  [34-135] 80 (12/11 0831) Resp:  [15-29] 19 (12/11 0831) BP: (68-146)/(29-103) 146/103 mmHg (12/11 0831) SpO2:  [92 %-100 %] 100 % (12/11 0831) Weight:  [74.2 kg (163 lb 9.3 oz)] 74.2 kg (163 lb 9.3 oz) (12/10 1816)  Weight change:  Filed Weights   11/25/15 1816  Weight: 74.2 kg (163 lb 9.3 oz)    Intake/Output:    Intake/Output Summary (Last 24 hours) at 11/26/15 1008 Last data filed at 11/26/15 0600  Gross per 24 hour  Intake 1413.53 ml  Output      0 ml  Net 1413.53 ml     Physical Exam: General: NAD, Laying in bed  HEENT Moist oral mucus membranes  Neck supple  Pulm/lungs Clear b/l, normal resp effort  CVS/Heart Irregular, soft systoic murmur  Abdomen:  Soft, NT, Non distended  Extremities: No peripheral edema  Neurologic: Alert, answers questions  Skin: No acute rashes  Access: Rt IJ PC, Left arm AVF geveloping       Basic Metabolic Panel:   Recent Labs Lab 11/25/15 1819 11/25/15 2220 11/26/15 0200  NA 132*  --  133*  K 2.4* 2.4* 3.1*  CL 100*  --  106  CO2 24  --  22  GLUCOSE 158*  --  311*  BUN 10  --  12  CREATININE 2.06*  --  2.26*  CALCIUM 8.2*  --  7.3*     CBC:  Recent Labs Lab 11/25/15 1819 11/26/15 0200  WBC 3.9 12.3*  NEUTROABS 3.3  --   HGB 9.5* 8.6*  HCT 28.0* 26.0*  MCV 110.1* 111.4*  PLT 89* 100*      Microbiology:  Recent Results (from the past 720 hour(s))   Urine culture     Status: None (Preliminary result)   Collection Time: 11/25/15  6:19 PM  Result Value Ref Range Status   Specimen Description URINE, RANDOM  Final   Special Requests NONE  Final   Culture HOLDING FOR POSSIBLE PATHOGEN  Final   Report Status PENDING  Incomplete  Blood Culture (routine x 2)     Status: None (Preliminary result)   Collection Time: 11/25/15  6:22 PM  Result Value Ref Range Status   Specimen Description BLOOD RIGHT ARM  Final   Special Requests BOTTLES DRAWN AEROBIC AND ANAEROBIC  1CC  Final   Culture NO GROWTH < 12 HOURS  Final   Report Status PENDING  Incomplete  Blood Culture (routine x 2)     Status: None (Preliminary result)   Collection Time: 11/25/15  6:23 PM  Result Value Ref Range Status   Specimen Description BLOOD RIGHT AC  Final   Special Requests BOTTLES DRAWN AEROBIC AND ANAEROBIC  1CC  Final   Culture NO GROWTH < 12 HOURS  Final   Report Status PENDING  Incomplete  MRSA PCR Screening     Status: None  Collection Time: 11/25/15  9:10 PM  Result Value Ref Range Status   MRSA by PCR NEGATIVE NEGATIVE Final    Comment:        The GeneXpert MRSA Assay (FDA approved for NASAL specimens only), is one component of a comprehensive MRSA colonization surveillance program. It is not intended to diagnose MRSA infection nor to guide or monitor treatment for MRSA infections.     Coagulation Studies: No results for input(s): LABPROT, INR in the last 72 hours.  Urinalysis:  Recent Labs  11/25/15 1819  COLORURINE AMBER*  LABSPEC 1.017  PHURINE 5.0  GLUCOSEU NEGATIVE  HGBUR 2+*  BILIRUBINUR 1+*  KETONESUR NEGATIVE  PROTEINUR 30*  NITRITE NEGATIVE  LEUKOCYTESUR 2+*      Imaging: Dg Chest Port 1 View  11/25/2015  CLINICAL DATA:  79 year old with acute mental status changes, fever, and urine with a strong smell that began yesterday. Patient began hemodialysis 1 month ago. EXAM: PORTABLE CHEST 1 VIEW COMPARISON:  None. FINDINGS:  Right jugular dialysis catheter tips project over the lower SVC. Cardiac silhouette markedly enlarged. Pulmonary vascularity normal without evidence pulmonary edema. Dense airspace consolidation in the left lower lobe. Bilateral pleural effusions suspected. IMPRESSION: 1. Dense left lower lobe atelectasis and/or pneumonia. 2. Marked cardiomegaly without evidence of pulmonary edema. 3. Bilateral pleural effusions suspected. Electronically Signed   By: Hulan Saashomas  Lawrence M.D.   On: 11/25/2015 18:44     Medications:   . 0.9 % NaCl with KCl 20 mEq / L Stopped (11/26/15 0923)  . phenylephrine (NEO-SYNEPHRINE) Adult infusion 75 mcg/min (11/26/15 0817)   . azithromycin  250 mg Oral Daily  . heparin  5,000 Units Subcutaneous 3 times per day  . mirtazapine  15 mg Oral QHS  . multivitamin with minerals  1 tablet Oral Q supper  . pantoprazole  40 mg Oral Q breakfast  . piperacillin-tazobactam (ZOSYN)  IV  3.375 g Intravenous 3 times per day  . potassium chloride  40 mEq Oral BID  . rifaximin  200 mg Oral BID  . sodium chloride  3 mL Intravenous Q12H  . [START ON 11/27/2015] vancomycin  750 mg Intravenous Q M,W,F-HD   acetaminophen **OR** acetaminophen, loperamide  Assessment/ Plan:  79 y.o. male with ESRD,  h/o alcohol abuse, pancytopenia, hepatic cirrhosis    HD: MWF [facility]/ Dr Wynelle LinkKolluru  1. ESRD.  - Next HD planned for Monday  2. Anemia of CKD: hemoglobin 8.6 - epo with hemodialysis   3. SHPTH of renal origin:  - Monitor phos while in hospital  4.Fever with lactic acidosis- Likely sepsis from UTI - requiring pressors - Permcath is being used for central access as no CVL available - DDx includes, pneumonia, C Diff, Permcath infection, UTI CXR suspicious for left lower lobe pneumonia (family reports cough at home. Patient denies exessive cough) U/A 2+ LE, TNTC WBC- culture pending Blood culture report is pending Currently empiric VANC and Zosyn     LOS:  1 Reema Chick 12/11/201610:08 AM

## 2015-11-26 NOTE — Consult Note (Signed)
[facility] [place] Critical Care Medicine Consultation     ASSESSMENT/PLAN    RENAL A:  End-stage renal disease. -UTI with sepsis. P:   Continue antibiotics.   PULMONARY  A: Possible pneumonia P:   -Given lack of respiratory failure. At this time. Pneumonia seems doubtful.  CARDIOVASCULAR  A: Hypotension due to septic shock. P:  Minimize fluids given due to End-stage renal disease. The patient has already received down to 1.5 L of IV fluid. -Wean down IV pressors. -Discussed with nephrology service, can continue pressors via the patient's permacath for now, however, if this cannot be weaned down further, may need to consider central line placement.    GASTROINTESTINAL A:  Cirrhosis P:   Stable  HEMATOLOGIC A:  Anemia P:  We'll continue to monitor.  INFECTIOUS A:  Sepsis P:   -Suspect due to UTI, continue antibiotics.  BCx2 12/10: Negative thus far. UC 12/10: Pending. Urine culture 8/24: Escherichia coli: Pansensitive.  Zosyn: 12/10>> Vancomycin: 12/10>>  ENDOCRINE   NEUROLOGIC A:  Confusion/delirium. P:   -    INDWELLING DEVICES:: Permacath.    ---------------------------------------  ---------------------------------------   Name: Mike Booker MRN: [id number] DOB: October 17, 1936    ADMISSION DATE:  11/25/2015 CONSULTATION DATE:  12/11  REFERRING MD :  Dr. Winona Legato  CHIEF COMPLAINT:  Confusion.    HISTORY OF PRESENT ILLNESS:   The patient is a 79 year old male, with a history of end-stage renal disease, recently started on dialysis. He had a AV fistula placed approximately 1 month ago. He has been dialyzed through a permacath. Patient is currently somewhat confused. Therefore, all history was obtained from the chart and from staff. He has a history of cirrhosis due to liver disease. Upon presentation to the hospital. It was noted that his temperature was 104 rectally, he also had sinus tachycardia with a heart rate of 120, subsequently  blood pressure was noted to be 68/38, and the patient was started on IV pressors. His oxygen saturation have remained 95% or above on room air.  Per the H&P: He has been coughing with dry cough. No phlegm production. He's been intermittently having loose stools and he was noted to have unpleasant urine smell and was brought to emergency room for further evaluation. In emergency room, he was noted to be hypotensive, tachycardic, he was febrile with temperature of 104 rectally, labs revealed hypokalemia with potassium level of 2.4. Elevated total bilirubin as well as elevated ammonia level elevated troponin and lactic acid level of 3.8. Urinalysis revealed pyuria. Patient chest x-ray showed left lower lobe pneumonia. Hospitalist services were contacted for admission    Reviewed the chest x-ray report and images, as appears to show some scattered infiltrates and bibasilar atelectasis.  PAST MEDICAL HISTORY :  Past Medical History  Diagnosis Date  . Alcoholic cirrhosis (HCC)   . Thrombocytopenia (HCC)   . High serum ferritin   . Hepatorenal syndrome (HCC)     Secondary  . IDA (iron deficiency anemia) 05/24/2015  . Hypertension   . Diabetes mellitus without complication (HCC)     no longer diabetic since 3 yr ago   Past Surgical History  Procedure Laterality Date  . Tonsillectomy    . Fracture surgery  left ankle    still has screws  . Peripheral vascular catheterization N/A 10/12/2015    Procedure: Dialysis/Perma Catheter Insertion;  Surgeon: Annice Needy, MD;  Location: [facility] INVASIVE CV LAB;  Service: Cardiovascular;  Laterality: N/A;  . Av fistula placement Left 10/19/2015  Procedure: INSERTION OF ARTERIOVENOUS (AV) GORE-TEX GRAFT ARM;  Surgeon: Annice Needy, MD;  Location: [facility] ORS;  Service: Vascular;  Laterality: Left;   Prior to Admission medications   Medication Sig Start Date End Date Taking? Authorizing Provider  furosemide (LASIX) 20 MG tablet Take 20 mg by mouth daily.    Yes  Historical Provider, MD  loperamide (IMODIUM) 2 MG capsule Take 2 mg by mouth as needed for diarrhea or loose stools.    Yes Historical Provider, MD  mirtazapine (REMERON) 15 MG tablet Take 15 mg by mouth at bedtime. 11/13/15  Yes Historical Provider, MD  Multiple Vitamin (MULTIVITAMIN WITH MINERALS) TABS tablet Take 1 tablet by mouth daily.   Yes Historical Provider, MD  omeprazole (PRILOSEC) 40 MG capsule Take 40 mg by mouth daily.   Yes Historical Provider, MD  HYDROcodone-acetaminophen (NORCO/VICODIN) 5-325 MG tablet Take 1-2 tablets by mouth every 6 (six) hours as needed for moderate pain. 10/19/15   Annice Needy, MD   No Known Allergies  FAMILY HISTORY:  Family History  Problem Relation Age of Onset  . Cancer - Colon Mother 23  . Breast cancer Sister 49  . Prostate cancer Other    SOCIAL HISTORY:  reports that he quit smoking about 49 years ago. His smoking use included Cigarettes. His smokeless tobacco use includes Chew. He reports that he drinks alcohol. He reports that he does not use illicit drugs.  REVIEW OF SYSTEMS:   Could not obtain due to confusion.   VITAL SIGNS: Temp:  [97.5 F (36.4 C)-104 F (40 C)] 97.5 F (36.4 C) (12/11 0831) Pulse Rate:  [34-135] 80 (12/11 0831) Resp:  [15-29] 19 (12/11 0831) BP: (68-146)/(29-103) 146/103 mmHg (12/11 0831) SpO2:  [92 %-100 %] 100 % (12/11 0831) Weight:  [74.2 kg (163 lb 9.3 oz)] 74.2 kg (163 lb 9.3 oz) (12/10 1816) HEMODYNAMICS:   VENTILATOR SETTINGS:   INTAKE / OUTPUT:  Intake/Output Summary (Last 24 hours) at 11/26/15 1103 Last data filed at 11/26/15 0600  Gross per 24 hour  Intake 1413.53 ml  Output      0 ml  Net 1413.53 ml    Physical Examination:   VS: BP 146/103 mmHg  Pulse 80  Temp(Src) 97.5 F (36.4 C) (Rectal)  Resp 19  Ht  (1.727 m)  Wt 74.2 kg (163 lb 9.3 oz)  BMI 24.88 kg/m2  SpO2 100%  General Appearance: No distress  Neuro:without focal findings, mental status, confused, speech normal  intact, reflexes normal and symmetric, sensation grossly normal  HEENT: PERRLA, EOM intact,  Pulmonary: normal breath sounds., diaphragmatic excursion normal.No wheezing, No rales;     CardiovascularNormal S1,S2.  No m/r/g.    Abdomen: Benign, Soft, non-tender, No masses, hepatosplenomegaly, No lymphadenopathy Renal:  No costovertebral tenderness  GU:  Not performed at this time. Endoc: No evident thyromegaly, no signs of acromegaly. Skin:   warm, no rashes, no ecchymosis  Extremities: normal, no cyanosis, clubbing, no edema, warm with reduced capillary refill.    LABS: Reviewed   LABORATORY PANEL:   CBC  Recent Labs Lab 11/26/15 0200  WBC 12.3*  HGB 8.6*  HCT 26.0*  PLT 100*    Chemistries   Recent Labs Lab 11/25/15 1819  11/26/15 0200  NA 132*  --  133*  K 2.4*  < > 3.1*  CL 100*  --  106  CO2 24  --  22  GLUCOSE 158*  --  311*  BUN 10  --  12  CREATININE 2.06*  --  2.26*  CALCIUM 8.2*  --  7.3*  AST 251*  --   --   ALT 65*  --   --   ALKPHOS 217*  --   --   BILITOT 4.1*  --   --   < > = values in this interval not displayed.   Recent Labs Lab 11/25/15 2118  GLUCAP 165*   No results for input(s): PHART, PCO2ART, PO2ART in the last 168 hours.  Recent Labs Lab 11/25/15 1819  AST 251*  ALT 65*  ALKPHOS 217*  BILITOT 4.1*  ALBUMIN 2.0*    Cardiac Enzymes  Recent Labs Lab 11/26/15 0914  TROPONINI 0.04*    RADIOLOGY:  Dg Chest Port 1 View  11/25/2015  CLINICAL DATA:  79 year old with acute mental status changes, fever, and urine with a strong smell that began yesterday. Patient began hemodialysis 1 month ago. EXAM: PORTABLE CHEST 1 VIEW COMPARISON:  None. FINDINGS: Right jugular dialysis catheter tips project over the lower SVC. Cardiac silhouette markedly enlarged. Pulmonary vascularity normal without evidence pulmonary edema. Dense airspace consolidation in the left lower lobe. Bilateral pleural effusions suspected. IMPRESSION: 1. Dense left  lower lobe atelectasis and/or pneumonia. 2. Marked cardiomegaly without evidence of pulmonary edema. 3. Bilateral pleural effusions suspected. Electronically Signed   By: Hulan Saashomas  Lawrence M.D.   On: 11/25/2015 18:44       --Wells Guileseep Trashaun Streight, MD.  Board Certified in Internal Medicine, Pulmonary Medicine, Critical Care Medicine, and Sleep Medicine.  [facility]  Santiago Gladavid Kasa, M.D.  Stephanie AcreVishal Mungal, M.D.  Billy Fischeravid Simonds, M.D   11/26/2015, 11:03 AM  Critical Care Attestation.  I have personally obtained a history, examined the patient, evaluated laboratory and imaging results, formulated the assessment and plan and placed orders. The Patient requires high complexity decision making for assessment and support, frequent evaluation and titration of therapies, application of advanced monitoring technologies and extensive interpretation of multiple databases. The patient has critical illness that could lead imminently to failure of 1 or more organ systems and requires the highest level of physician preparedness to intervene.  Critical Care Time devoted to patient care services described in this note is 35 minutes and is exclusive of time spent in procedures.

## 2015-11-26 NOTE — Progress Notes (Signed)
Pts wife came out of room to report that patient was sending her to get his Standley DakinsScotch from home.  The patient and wife reports he consumes "2 drinks" of scotch each day.  He sips on them all day.  I explained to patient that with his current blood pressure and health status he should not consume any alcohol.  He verbalized understanding.  Will make MD aware of possible DT's and need for CIWA protocol.

## 2015-11-26 NOTE — Progress Notes (Signed)
ANTIBIOTIC CONSULT NOTE - INITIAL  Pharmacy Consult for Vancomycin & Zosyn Indication: rule out pneumonia and rule out sepsis  No Known Allergies  Patient Measurements: Height:  (172.7 cm) Weight: 163 lb 9.3 oz (74.2 kg) IBW/kg (Calculated) : 68.4  Vital Signs: Temp: 97.7 F (36.5 C) (12/11 0815) Temp Source: Rectal (12/10 2120) BP: 86/58 mmHg (12/11 0815) Pulse Rate: 50 (12/11 0815) Intake/Output from previous day: 12/10 0701 - 12/11 0700 In: 1413.5 [I.V.:913.5; IV Piggyback:500] Out: -   Recent Labs  11/25/15 1819 11/26/15 0200  WBC 3.9 12.3*  HGB 9.5* 8.6*  PLT 89* 100*  CREATININE 2.06* 2.26*   Estimated Creatinine Clearance: 26.5 mL/min (by C-G formula based on Cr of 2.26). No results for input(s): VANCOTROUGH, VANCOPEAK, VANCORANDOM, GENTTROUGH, GENTPEAK, GENTRANDOM, TOBRATROUGH, TOBRAPEAK, TOBRARND, AMIKACINPEAK, AMIKACINTROU, AMIKACIN in the last 72 hours.   Microbiology: Recent Results (from the past 720 hour(s))  Blood Culture (routine x 2)     Status: None (Preliminary result)   Collection Time: 11/25/15  6:22 PM  Result Value Ref Range Status   Specimen Description BLOOD RIGHT ARM  Final   Special Requests BOTTLES DRAWN AEROBIC AND ANAEROBIC  1CC  Final   Culture NO GROWTH < 12 HOURS  Final   Report Status PENDING  Incomplete  Blood Culture (routine x 2)     Status: None (Preliminary result)   Collection Time: 11/25/15  6:23 PM  Result Value Ref Range Status   Specimen Description BLOOD RIGHT AC  Final   Special Requests BOTTLES DRAWN AEROBIC AND ANAEROBIC  1CC  Final   Culture NO GROWTH < 12 HOURS  Final   Report Status PENDING  Incomplete  MRSA PCR Screening     Status: None   Collection Time: 11/25/15  9:10 PM  Result Value Ref Range Status   MRSA by PCR NEGATIVE NEGATIVE Final    Comment:        The GeneXpert MRSA Assay (FDA approved for NASAL specimens only), is one component of a comprehensive MRSA colonization surveillance program.  It is not intended to diagnose MRSA infection nor to guide or monitor treatment for MRSA infections.     Medical History: Past Medical History  Diagnosis Date  . Alcoholic cirrhosis (HCC)   . Thrombocytopenia (HCC)   . High serum ferritin   . Hepatorenal syndrome (HCC)     Secondary  . IDA (iron deficiency anemia) 05/24/2015  . Hypertension   . Diabetes mellitus without complication (HCC)     no longer diabetic since 3 yr ago    Medications:  Anti-infectives    Start     Dose/Rate Route Frequency Ordered Stop   11/27/15 1200  vancomycin (VANCOCIN) IVPB 750 mg/150 ml premix     750 mg 150 mL/hr over 60 Minutes Intravenous Every M-W-F (Hemodialysis) 11/25/15 1958     11/26/15 1000  azithromycin (ZITHROMAX) tablet 250 mg     250 mg Oral Daily 11/25/15 2005 11/30/15 0959   11/26/15 0800  piperacillin-tazobactam (ZOSYN) IVPB 3.375 g     3.375 g 12.5 mL/hr over 240 Minutes Intravenous 3 times per day 11/26/15 0808     11/26/15 0700  piperacillin-tazobactam (ZOSYN) IVPB 3.375 g  Status:  Discontinued     3.375 g 12.5 mL/hr over 240 Minutes Intravenous Every 12 hours 11/25/15 1958 11/26/15 0808   11/25/15 2200  rifaximin (XIFAXAN) tablet 200 mg     200 mg Oral 2 times daily 11/25/15 2005     11/25/15  2015  azithromycin (ZITHROMAX) tablet 500 mg     500 mg Oral Daily 11/25/15 2005 11/25/15 2049   11/25/15 1930  vancomycin (VANCOCIN) 1,500 mg in sodium chloride 0.9 % 500 mL IVPB     1,500 mg 250 mL/hr over 120 Minutes Intravenous  Once 11/25/15 1930 11/25/15 2227   11/25/15 1900  piperacillin-tazobactam (ZOSYN) IVPB 3.375 g  Status:  Discontinued     3.375 g 100 mL/hr over 30 Minutes Intravenous  Once 11/25/15 1857 11/25/15 1857   11/25/15 1830  piperacillin-tazobactam (ZOSYN) IVPB 3.375 g     3.375 g 100 mL/hr over 30 Minutes Intravenous  Once 11/25/15 1817 11/25/15 2021   11/25/15 1830  vancomycin (VANCOCIN) IVPB 1000 mg/200 mL premix  Status:  Discontinued     1,000 mg 200  mL/hr over 60 Minutes Intravenous  Once 11/25/15 1817 11/25/15 1929     Assessment: 79 yo male admitted with cough, weakness and confusion.  Suspect Left Lower lobe pneumonia.   Currently on dialysis M-W-F. Pharmacy consulted for dosing and monitoring of vancomycina nd Zosyn for treatment of suspected pneumonia and rule out sepsis. Loading Dose of Vancomycin 1500mg  given on 11/25/15. Patient started on Zosyn 3.375 q12 hours.   Goal of Therapy:  Vancomycin trough level 15-20 mcg/ml  Plan:  Will continue Vancomycin 750mg  after each Dialysis session (currently MWF). Will change Zosyn 3.375 IV to q8h based on CrCl >5320min/min Recommend narrowing of therapy and discontinuation of vancomycin on 12/12 based on negative MRSA PCR.  Pharmacy will follow up on micro and continue to monitor renal function and labs and make adjustments as needed.   Cher NakaiSheema Tim Corriher, PharmD Pharmacy Resident  11/26/2015,8:20 AM

## 2015-11-26 NOTE — Consult Note (Signed)
[facility] Vascular Consult Note  MRN : [id number]  Mike Booker is a 79 y.o. (04-08-1936) male who presents with chief complaint of  Chief Complaint  Patient presents with  . Altered Mental Status  .  History of Present Illness: Patient is admitted to the hospital yesterday with altered mental status and found to have a temperature of 104. His altered mental status started a couple of days ago.  There was no clear inciting event.  He has a history of cirrhosis and was started on dialysis a month or 2 ago. An AV fistula was placed about 5-1/2 weeks ago in hopes of this being his permanent dialysis access. He did well from that surgery and has had no obvious complications. The fistula is enlarging and has a good thrill, and it is not clear if this can be used for dialysis were not. It was felt he had a pneumonia on his chest x-ray and there was also concern about urinary tract infection. Blood cultures are pending. His PermCath is in place and is being used both for his IV access as well as his dialysis access. He is more alert today although he seems to be at his baseline which is of some mild confusion. He remembers the he had a scheduled appointment in our office upcoming and new who I was on presentation. His fever curve is down since the initiation of antibiotics.  Current Facility-Administered Medications  Medication Dose Route Frequency Provider Last Rate Last Dose  . 0.9 % NaCl with KCl 20 mEq/ L  infusion   Intravenous Continuous Katharina Caper, MD   Stopped at 11/26/15 [phone]  . acetaminophen (TYLENOL) tablet 650 mg  650 mg Oral Q6H PRN Katharina Caper, MD       Or  . acetaminophen (TYLENOL) suppository 650 mg  650 mg Rectal Q6H PRN Katharina Caper, MD      . azithromycin (ZITHROMAX) tablet 250 mg  250 mg Oral Daily Katharina Caper, MD   250 mg at 11/26/15 1028  . heparin injection 5,000 Units  5,000 Units Subcutaneous 3 times per day Katharina Caper, MD   5,000 Units at  11/26/15 0550  . loperamide (IMODIUM) capsule 2 mg  2 mg Oral PRN Katharina Caper, MD      . mirtazapine (REMERON) tablet 15 mg  15 mg Oral QHS Katharina Caper, MD   15 mg at 11/25/15 2231  . multivitamin with minerals tablet 1 tablet  1 tablet Oral Q supper Katharina Caper, MD      . pantoprazole (PROTONIX) EC tablet 40 mg  40 mg Oral Q breakfast Katharina Caper, MD   40 mg at 11/26/15 0816  . phenylephrine (NEO-SYNEPHRINE) 40 mg in dextrose 5 % 250 mL (0.16 mg/mL) infusion  30-200 mcg/min Intravenous Titrated Katharina Caper, MD 28.1 mL/hr at 11/26/15 0817 75 mcg/min at 11/26/15 0817  . piperacillin-tazobactam (ZOSYN) IVPB 3.375 g  3.375 g Intravenous 3 times per day Altamese Dilling, MD   3.375 g at 11/26/15 0816  . potassium chloride (KLOR-CON) packet 40 mEq  40 mEq Oral BID Emily Filbert, MD   40 mEq at 11/26/15 1028  . rifaximin (XIFAXAN) tablet 200 mg  200 mg Oral BID Katharina Caper, MD   200 mg at 11/26/15 1028  . sodium chloride 0.9 % injection 3 mL  3 mL Intravenous Q12H Katharina Caper, MD   3 mL at 11/26/15 1029  . [START ON 11/27/2015] vancomycin (VANCOCIN) IVPB 750 mg/150 ml premix  750 mg Intravenous Q M,W,F-HD Katharina Caper, MD        Past Medical History  Diagnosis Date  . Alcoholic cirrhosis (HCC)   . Thrombocytopenia (HCC)   . High serum ferritin   . Hepatorenal syndrome (HCC)     Secondary  . IDA (iron deficiency anemia) 05/24/2015  . Hypertension   . Diabetes mellitus without complication (HCC)     no longer diabetic since 3 yr ago    Past Surgical History  Procedure Laterality Date  . Tonsillectomy    . Fracture surgery  left ankle    still has screws  . Peripheral vascular catheterization N/A 10/12/2015    Procedure: Dialysis/Perma Catheter Insertion;  Surgeon: Annice Needy, MD;  Location: [facility] INVASIVE CV LAB;  Service: Cardiovascular;  Laterality: N/A;  . Av fistula placement Left 10/19/2015    Procedure: INSERTION OF ARTERIOVENOUS (AV) GORE-TEX GRAFT ARM;   Surgeon: Annice Needy, MD;  Location: [facility] ORS;  Service: Vascular;  Laterality: Left;    Social History Social History  Substance Use Topics  . Smoking status: Former Smoker    Types: Cigarettes    Quit date: 12/16/1965  . Smokeless tobacco: Current User    Types: Chew  . Alcohol Use: Yes     Comment: 2 "heavy" drinks per day/   married  Family History Family History  Problem Relation Age of Onset  . Cancer - Colon Mother 49  . Breast cancer Sister 71  . Prostate cancer Other    no bleeding or clotting disorders  No Known Allergies   REVIEW OF SYSTEMS (Negative unless checked)  Constitutional: Weight loss  Fever  Chills Cardiac: Chest pain   Chest pressure   Palpitations   Shortness of breath when laying flat   Shortness of breath at rest   Shortness of breath with exertion. Vascular:  Pain in legs with walking   Pain in legs at rest   Pain in legs when laying flat   Claudication   Pain in feet when walking  Pain in feet at rest  Pain in feet when laying flat   History of DVT   Phlebitis   Swelling in legs   Varicose veins   Non-healing ulcers Pulmonary:   Uses home oxygen   Productive cough   Hemoptysis   Wheeze  COPD   Asthma Neurologic:  Dizziness  Blackouts   Seizures   History of stroke   History of TIA  Aphasia   Temporary blindness   Dysphagia   Weakness or numbness in arms   Weakness or numbness in legs Musculoskeletal:  Arthritis   Joint swelling   Joint pain   Low back pain Hematologic:  Easy bruising  Easy bleeding   Hypercoagulable state   Anemic  Hepatitis Gastrointestinal:  Blood in stool   Vomiting blood  Gastroesophageal reflux/heartburn   Difficulty swallowing. Genitourinary:  Chronic kidney disease   Difficult urination  Frequent urination  Burning with urination   Blood in urine Skin:  Rashes   Ulcers   Wounds Psychological:  History of  anxiety    History of major depression.  Physical Examination  Filed Vitals:   11/26/15 0800 11/26/15 0811 11/26/15 0815 11/26/15 0831  BP: 146/103  Pulse: 55 54 50 80  Temp: 97.7 F (36.5 C) 97.7 F (36.5 C) 97.7 F (36.5 C) 97.5 F (36.4 C)  TempSrc:      Resp: Height:  Weight:      SpO2: 100% 100% 100% 100%   Body mass index is 24.88 kg/(m^2). Gen:  WD/WN, NAD Head: [place]/AT, No temporalis wasting. Prominent temp pulse not noted. Ear/Nose/Throat: Hearing grossly intact, nares w/o erythema or drainage, oropharynx w/o Erythema/Exudate Eyes: PERRLA, EOMI.  Neck: Supple, no nuchal rigidity.  No JVD.  Pulmonary:  Good air movement, no use of accessory muscles Cardiac: Tachycardic and somewhat irregular Vascular: Right IJ PermCath without erythema or drainage. Left brachiocephalic AV fistula is patent with palpable thrill. The vein is easily visualized and is of decent size. It is tortuous and quite superficial. Vessel Right Left  Radial Palpable Palpable                                   Gastrointestinal: soft, non-tender/non-distended. No guarding/reflex. No masses, surgical incisions, or scars. Musculoskeletal: M/S 5/5 throughout.  Extremities without ischemic changes.  No deformity or atrophy. No edema. Neurologic: CN 2-12 intact. Pain and light touch intact in extremities.  Symmetrical.  Speech is fluent. Motor exam as listed above. Psychiatric: Judgment intact, Mood & affect appropriate for pt's clinical situation. Dermatologic: No rashes or ulcers noted. Skin integrity is quite poor and skin is thin throughout the body. Significant bruising seen throughout the arms. Lymph : No Cervical, Axillary, or Inguinal lymphadenopathy.      CBC Lab Results  Component Value Date   WBC 12.3* 11/26/2015   HGB 8.6* 11/26/2015   HCT 26.0* 11/26/2015   MCV 111.4* 11/26/2015   PLT 100* 11/26/2015    BMET    Component Value Date/Time    NA 133* 11/26/2015 0200   NA 139 12/21/2013 1412   K 3.1* 11/26/2015 0200   K 3.9 12/21/2013 1412   CL 106 11/26/2015 0200   CL 100 12/21/2013 1412   CO2 22 11/26/2015 0200   CO2 26 12/21/2013 1412   GLUCOSE 311* 11/26/2015 0200   GLUCOSE 131* 12/21/2013 1412   BUN 12 11/26/2015 0200   BUN 62* 12/21/2013 1412   CREATININE 2.26* 11/26/2015 0200   CREATININE 4.59* 12/21/2013 1412   CALCIUM 7.3* 11/26/2015 0200   CALCIUM 9.6 12/21/2013 1412   GFRNONAA 26* 11/26/2015 0200   GFRNONAA 12* 12/21/2013 1412   GFRAA 30* 11/26/2015 0200   GFRAA 13* 12/21/2013 1412   Estimated Creatinine Clearance: 26.5 mL/min (by C-G formula based on Cr of 2.26).  COAG Lab Results  Component Value Date   INR 1.3 05/06/2013    Radiology [place]  11/25/2015  CLINICAL DATA:  79 year old with acute mental status changes, fever, and urine with a strong smell that began yesterday. Patient began hemodialysis 1 month ago. EXAM: PORTABLE CHEST 1 VIEW COMPARISON:  None. FINDINGS: Right jugular dialysis catheter tips project over the lower SVC. Cardiac silhouette markedly enlarged. Pulmonary vascularity normal without evidence pulmonary edema. Dense airspace consolidation in the left lower lobe. Bilateral pleural effusions suspected. IMPRESSION: 1. Dense left lower lobe atelectasis and/or pneumonia. 2. Marked cardiomegaly without evidence of pulmonary edema. 3. Bilateral pleural effusions suspected. Electronically Signed   By: Hulan Saashomas  Lawrence M.D.   On: 11/25/2015 18:44      Assessment/Plan 1. Sepsis. Source not entirely clear. If patient is bacteremic, his PermCath will need to, out and we will follow this over the next day or 2. If his blood cultures come back positive today, we will take out his PermCath tomorrow. Alternate sources  of IV access will need to be obtained at that point. 2. His fistula is 5-1/2 weeks status post placement. It is reasonably large and has a decent thrill and I think this  can be used for his dialysis access starting this week. It is tortuous, and I suspect that access initially will not be easy. This will need to be done with small needles and an experienced individual like we have here at the hospital. His fistula is very unlikely to be the source of his infection. 3. Cirrhosis. Makes his overall prognosis with dialysis quite poor.   Festus Barren, MD  11/26/2015 10:34 AM

## 2015-11-27 DIAGNOSIS — A419 Sepsis, unspecified organism: Secondary | ICD-10-CM

## 2015-11-27 LAB — RENAL FUNCTION PANEL
ANION GAP: 3 — AB (ref 5–15)
Albumin: 1.6 g/dL — ABNORMAL LOW (ref 3.5–5.0)
BUN: 16 mg/dL (ref 6–20)
CHLORIDE: 108 mmol/L (ref 101–111)
CO2: 23 mmol/L (ref 22–32)
Calcium: 7.8 mg/dL — ABNORMAL LOW (ref 8.9–10.3)
Creatinine, Ser: 2.41 mg/dL — ABNORMAL HIGH (ref 0.61–1.24)
GFR, EST AFRICAN AMERICAN: 28 mL/min — AB (ref >60)
GFR, EST NON AFRICAN AMERICAN: 24 mL/min — AB (ref >60)
Glucose, Bld: 128 mg/dL — ABNORMAL HIGH (ref 65–99)
POTASSIUM: 4.1 mmol/L (ref 3.5–5.1)
Phosphorus: <1 mg/dL — CL (ref 2.5–4.6)
Sodium: 134 mmol/L — ABNORMAL LOW (ref 135–145)

## 2015-11-27 LAB — CBC
HEMATOCRIT: 26.4 % — AB (ref 40.0–52.0)
HEMOGLOBIN: 8.9 g/dL — AB (ref 13.0–18.0)
MCH: 36.9 pg — AB (ref 26.0–34.0)
MCHC: 33.7 g/dL (ref 32.0–36.0)
MCV: 109.8 fL — AB (ref 80.0–100.0)
PLATELETS: 87 10*3/uL — AB (ref 150–440)
RBC: 2.41 MIL/uL — AB (ref 4.40–5.90)
RDW: 18.2 % — ABNORMAL HIGH (ref 11.5–14.5)
WBC: 7.9 10*3/uL (ref 3.8–10.6)

## 2015-11-27 LAB — CLOSTRIDIUM DIFFICILE BY PCR: CDIFFPCR: POSITIVE — AB

## 2015-11-27 LAB — C DIFFICILE QUICK SCREEN W PCR REFLEX
C DIFFICLE (CDIFF) ANTIGEN: POSITIVE — AB
C Diff toxin: NEGATIVE

## 2015-11-27 MED ORDER — VANCOMYCIN 50 MG/ML ORAL SOLUTION
250.0000 mg | Freq: Four times a day (QID) | ORAL | Status: DC
  Administered 2015-11-27 – 2015-11-29 (×8): 250 mg via ORAL
  Filled 2015-11-27 (×12): qty 5

## 2015-11-27 MED ORDER — VASOPRESSIN 20 UNIT/ML IV SOLN
0.0300 [IU]/min | INTRAVENOUS | Status: DC
  Administered 2015-11-27 – 2015-11-28 (×2): 0.03 [IU]/min via INTRAVENOUS
  Filled 2015-11-27 (×2): qty 2

## 2015-11-27 MED ORDER — HYDROCORTISONE NA SUCCINATE PF 100 MG IJ SOLR
50.0000 mg | Freq: Four times a day (QID) | INTRAMUSCULAR | Status: DC
  Administered 2015-11-27 – 2015-11-29 (×9): 50 mg via INTRAVENOUS
  Filled 2015-11-27 (×9): qty 2

## 2015-11-27 MED ORDER — PIPERACILLIN-TAZOBACTAM 3.375 G IVPB
3.3750 g | Freq: Two times a day (BID) | INTRAVENOUS | Status: DC
  Filled 2015-11-27: qty 50

## 2015-11-27 MED ORDER — DEXTROSE 5 % IV SOLN
0.0000 ug/min | INTRAVENOUS | Status: DC
  Administered 2015-11-27: 20 ug/min via INTRAVENOUS
  Administered 2015-11-27: 30 ug/min via INTRAVENOUS
  Administered 2015-11-28: 10 ug/min via INTRAVENOUS
  Filled 2015-11-27 (×3): qty 16

## 2015-11-27 MED ORDER — ALPRAZOLAM 1 MG PO TABS
1.0000 mg | ORAL_TABLET | Freq: Three times a day (TID) | ORAL | Status: DC
  Administered 2015-11-27 – 2015-11-28 (×3): 1 mg via ORAL
  Filled 2015-11-27 (×3): qty 1

## 2015-11-27 MED ORDER — EPOETIN ALFA 10000 UNIT/ML IJ SOLN
10000.0000 [IU] | INTRAMUSCULAR | Status: DC
  Administered 2015-11-27: 10000 [IU] via INTRAVENOUS
  Filled 2015-11-27: qty 1

## 2015-11-27 MED ORDER — LORAZEPAM 2 MG/ML IJ SOLN
2.0000 mg | INTRAMUSCULAR | Status: DC | PRN
Start: 2015-11-27 — End: 2015-11-29
  Administered 2015-11-27 – 2015-11-29 (×7): 2 mg via INTRAVENOUS
  Filled 2015-11-27: qty 1
  Filled 2015-11-27: qty 2
  Filled 2015-11-27 (×5): qty 1

## 2015-11-27 MED ORDER — SODIUM PHOSPHATE 3 MMOLE/ML IV SOLN
20.0000 mmol | Freq: Once | INTRAVENOUS | Status: AC
  Administered 2015-11-27: 20 mmol via INTRAVENOUS
  Filled 2015-11-27: qty 6.67

## 2015-11-27 MED ORDER — DEXTROSE 5 % IV SOLN
1.0000 g | INTRAVENOUS | Status: DC
  Administered 2015-11-27 – 2015-11-28 (×2): 1 g via INTRAVENOUS
  Filled 2015-11-27 (×3): qty 10

## 2015-11-27 NOTE — Progress Notes (Signed)
eLink Physician-Brief Progress Note Patient Name: Mike FarrHerman Booker DOB: 12/11/1936 MRN: [id number]   Date of Service  11/27/2015  HPI/Events of Note   Recent Labs Lab 11/25/15 1819  11/26/15 0200 11/27/15 0310  NA 132*  --  133* 134*  K 2.4*  < > 3.1* 4.1  CL 100*  --  106 108  CO2 24  --  22 23  GLUCOSE 158*  --  311* 128*  BUN 10  --  12 16  CREATININE 2.06*  --  2.26* 2.41*  CALCIUM 8.2*  --  7.3* 7.8*  PHOS  --   --   --  <1.0*  < > = values in this interval not displayed.   eICU Interventions  Critically low phos  Plan 20 mmol Na phos     Intervention Category Intermediate Interventions: Electrolyte abnormality - evaluation and management  Anice Wilshire 11/27/2015, 4:31 AM

## 2015-11-27 NOTE — Progress Notes (Signed)
Pt initially alert and oriented however pt is now confused and agitated CIWA order set ordered per Dr Clovis FredricksonKasa's verbal orders and 1 mg xanax ordered tid for agitation;pt received hemodialysis today blood pressure dropped during dialysis vasopressin drip initiated per Dr. Clovis FredricksonKasa's orders goal map >60; pt had a total of 2 loose brown stools during shift; pts daughter updated about plan of care and questions answered will continue to monitor and assess pt

## 2015-11-27 NOTE — Progress Notes (Signed)
Called MD to report phosphorus less than 1.0 per lab. MD to place order.

## 2015-11-27 NOTE — Progress Notes (Signed)
[facility]  Daily Progress Note   Subjective  - * No surgery found *  More confused today Still on pressors C. Diff positive and has UTI as well as pneumonia Used Permcath for HD today  Objective Filed Vitals:   11/27/15 1400 11/27/15 1410 11/27/15 1445 11/27/15 1500  BP: 76/39  91/56 83/60  Pulse: 111 116 130   Temp:      TempSrc:      Resp: 31 23 26 19   Height:      Weight:      SpO2:   100%     Intake/Output Summary (Last 24 hours) at 11/27/15 1615 Last data filed at 11/27/15 1400  Gross per 24 hour  Intake 1486.95 ml  Output    -85 ml  Net 1571.95 ml    PULM  CTAB CV  RRR VASC  AVF with thrill present.  IJ permcath without erythema or drainage  Laboratory CBC    Component Value Date/Time   WBC 7.9 11/27/2015 0310   WBC 3.9 01/17/2014 1431   HGB 8.9* 11/27/2015 0310   HGB 9.0* 01/17/2014 1431   HCT 26.4* 11/27/2015 0310   HCT 26.8* 01/17/2014 1431   PLT 87* 11/27/2015 0310   PLT 101* 01/17/2014 1431    BMET    Component Value Date/Time   NA 134* 11/27/2015 0310   NA 139 12/21/2013 1412   K 4.1 11/27/2015 0310   K 3.9 12/21/2013 1412   CL 108 11/27/2015 0310   CL 100 12/21/2013 1412   CO2 23 11/27/2015 0310   CO2 26 12/21/2013 1412   GLUCOSE 128* 11/27/2015 0310   GLUCOSE 131* 12/21/2013 1412   BUN 16 11/27/2015 0310   BUN 62* 12/21/2013 1412   CREATININE 2.41* 11/27/2015 0310   CREATININE 4.59* 12/21/2013 1412   CALCIUM 7.8* 11/27/2015 0310   CALCIUM 9.6 12/21/2013 1412   GFRNONAA 24* 11/27/2015 0310   GFRNONAA 12* 12/21/2013 1412   GFRAA 28* 11/27/2015 0310   GFRAA 13* 12/21/2013 1412    Assessment/Planning: Pneumonia and UTI by CXR and culture.  ON treatment C. Diff.  Positive on test from last night Using permcath for HD   Sepsis is Likely from UTI, pneumonia, and C. Diff, not a primary permcath infection  As long as blood cultures are negative, can leave permcath in place  AVF with good thrill.  It is  slightly early as not quite 6 weeks, but if we have to use for HD can try to use with small needle.      DEW,JASON  11/27/2015, 4:15 PM

## 2015-11-27 NOTE — Consult Note (Signed)
[facility] Consultation     ASSESSMENT/PLAN  79 yo white male with ESRD on HD with acute septic shock-from UTi,c diff colitis, less likely pneumonia  RENAL A:  End-stage renal disease. -UTI with sepsis. P:   Continue antibiotics as prescribed   PULMONARY  A: Possible pneumonia P:   -Given lack of respiratory failure. At this time. Pneumonia seems doubtful.  CARDIOVASCULAR  A: Hypotension due to septic shock. P:  Change to Levophed  -will evaluate for central line placement -consider stress dose streroids -follow up cultures   GASTROINTESTINAL A:  Cirrhosis P:   Stable  HEMATOLOGIC A:  Anemia P:  We'll continue to monitor.  INFECTIOUS A:  Sepsis P:   -Suspect due to UTI, continue antibiotics.  BCx2 12/10: Negative thus far. UC 12/10: Pending. Urine culture 8/24: Escherichia coli: Pansensitive.  Zosyn: 12/10>> Vancomycin: 12/10>>    INDWELLING DEVICES:: Permacath.    ---------------------------------------  ---------------------------------------   Name: Mike Booker MRN: [id number] DOB: [birth year]    ADMISSION DATE:  11/25/2015 CONSULTATION DATE:  12/11  REFERRING MD :  Dr. Winona Legato  CHIEF COMPLAINT:  Septic shock,confusion   HISTORY OF PRESENT ILLNESS:   More alert and awake this AM, on vasorpessors, plan for central line placement No pain, no fevers, chills Still with diarrhea    Prior to Admission medications   Medication Sig Start Date End Date Taking? Authorizing Provider  furosemide (LASIX) 20 MG tablet Take 20 mg by mouth daily.    Yes Historical Provider, MD  loperamide (IMODIUM) 2 MG capsule Take 2 mg by mouth as needed for diarrhea or loose stools.    Yes Historical Provider, MD  mirtazapine (REMERON) 15 MG tablet Take 15 mg by mouth at bedtime. 11/13/15  Yes Historical Provider, MD  Multiple Vitamin (MULTIVITAMIN WITH MINERALS) TABS tablet Take 1 tablet by mouth daily.   Yes Historical Provider,  MD  omeprazole (PRILOSEC) 40 MG capsule Take 40 mg by mouth daily.   Yes Historical Provider, MD  HYDROcodone-acetaminophen (NORCO/VICODIN) 5-325 MG tablet Take 1-2 tablets by mouth every 6 (six) hours as needed for moderate pain. 10/19/15   Annice Needy, MD   No Known Allergies  Review of Systems  Constitutional: Negative for fever, chills and weight loss.  Respiratory: Negative for cough and shortness of breath.   Cardiovascular: Negative for chest pain.  Gastrointestinal: Positive for diarrhea. Negative for nausea, vomiting and abdominal pain.  Neurological: Negative.  Negative for speech change and focal weakness.  Psychiatric/Behavioral: Negative.   All other systems reviewed and are negative.   VITAL SIGNS: Temp:  [97.3 F (36.3 C)-99.1 F (37.3 C)] 97.8 F (36.6 C) (12/12 0800) Pulse Rate:  [37-108] 100 (12/12 0800) Resp:  [17-25] 17 (12/12 0800) BP: (60-103)/(39-69) 100/50 mmHg (12/12 0800) SpO2:  [94 %-100 %] 100 % (12/12 0800) HEMODYNAMICS:   VENTILATOR SETTINGS:   INTAKE / OUTPUT:  Intake/Output Summary (Last 24 hours) at 11/27/15 0933 Last data filed at 11/27/15 0900  Gross per 24 hour  Intake 1486.95 ml  Output    165 ml  Net 1321.95 ml    Physical Examination:   VS: BP 100/50 mmHg  Pulse 100  Temp(Src) 97.8 F (36.6 C) (Axillary)  Resp 17  Ht  (1.727 m)  Wt 163 lb 9.3 oz (74.2 kg)  BMI 24.88 kg/m2  SpO2 100%  General Appearance: No distress  Neuro:without focal findings, mental status, confused, speech normal intact, reflexes normal and symmetric, sensation grossly normal  HEENT: PERRLA, EOM intact,  Pulmonary: normal breath sounds., diaphragmatic excursion normal.No wheezing, No rales;     CardiovascularNormal S1,S2.  No m/r/g.    Abdomen: Benign, Soft, non-tender, No masses, hepatosplenomegaly, No lymphadenopathy Renal:  No costovertebral tenderness  Endoc: No evident thyromegaly, no signs of acromegaly. Skin:   warm, no rashes, no  ecchymosis  Extremities: normal, no cyanosis, clubbing, no edema, warm with reduced capillary refill.    LABS: Reviewed   LABORATORY PANEL:   CBC  Recent Labs Lab 11/27/15 0310  WBC 7.9  HGB 8.9*  HCT 26.4*  PLT 87*    Chemistries   Recent Labs Lab 11/25/15 1819  11/27/15 0310  NA 132*  < > 134*  K 2.4*  < > 4.1  CL 100*  < > 108  CO2 24  < > 23  GLUCOSE 158*  < > 128*  BUN 10  < > 16  CREATININE 2.06*  < > 2.41*  CALCIUM 8.2*  < > 7.8*  PHOS  --   --  <1.0*  AST 251*  --   --   ALT 65*  --   --   ALKPHOS 217*  --   --   BILITOT 4.1*  --   --   < > = values in this interval not displayed.   Recent Labs Lab 11/25/15 2118  GLUCAP 165*   No results for input(s): PHART, PCO2ART, PO2ART in the last 168 hours.  Recent Labs Lab 11/25/15 1819 11/27/15 0310  AST 251*  --   ALT 65*  --   ALKPHOS 217*  --   BILITOT 4.1*  --   ALBUMIN 2.0* 1.6*    Cardiac Enzymes  Recent Labs Lab 11/26/15 0914  TROPONINI 0.04*    RADIOLOGY:  Dg Chest Port 1 View  11/25/2015  CLINICAL DATA:  79 year old with acute mental status changes, fever, and urine with a strong smell that began yesterday. Patient began hemodialysis 1 month ago. EXAM: PORTABLE CHEST 1 VIEW COMPARISON:  None. FINDINGS: Right jugular dialysis catheter tips project over the lower SVC. Cardiac silhouette markedly enlarged. Pulmonary vascularity normal without evidence pulmonary edema. Dense airspace consolidation in the left lower lobe. Bilateral pleural effusions suspected. IMPRESSION: 1. Dense left lower lobe atelectasis and/or pneumonia. 2. Marked cardiomegaly without evidence of pulmonary edema. 3. Bilateral pleural effusions suspected. Electronically Signed   By: Hulan Saashomas  Lawrence M.D.   On: 11/25/2015 18:44      The Patient requires high complexity decision making for assessment and support, frequent evaluation and titration of therapies, application of advanced monitoring technologies and  extensive interpretation of multiple databases. Critical Care Time devoted to patient care services described in this note is 40 minutes.   Overall, patient is critically ill, prognosis is guarded.     Lucie LeatherKurian David Brittan Mapel, M.D.  [facility]  Medical Director [facility] Medical Director [facility]

## 2015-11-27 NOTE — Progress Notes (Signed)
ANTIBIOTIC CONSULT NOTE - INITIAL  Pharmacy Consult for Vancomycin & Zosyn Indication: rule out pneumonia and rule out sepsis  No Known Allergies  Patient Measurements: Height:  (172.7 cm) Weight: 171 lb 4.8 oz (77.7 kg) IBW/kg (Calculated) : 68.4  Vital Signs: Temp: 97.8 F (36.6 C) (12/12 0800) Temp Source: Axillary (12/12 0800) BP: 78/37 mmHg (12/12 1130) Pulse Rate: 81 (12/12 1130) Intake/Output from previous day: 12/11 0701 - 12/12 0700 In: 1247 [P.O.:120; I.V.:670.3; IV Piggyback:456.7] Out: 165 [Urine:165]  Recent Labs  11/25/15 1819 11/26/15 0200 11/27/15 0310  WBC 3.9 12.3* 7.9  HGB 9.5* 8.6* 8.9*  PLT 89* 100* 87*  CREATININE 2.06* 2.26* 2.41*   Estimated Creatinine Clearance: 24.8 mL/min (by C-G formula based on Cr of 2.41). No results for input(s): VANCOTROUGH, VANCOPEAK, VANCORANDOM, GENTTROUGH, GENTPEAK, GENTRANDOM, TOBRATROUGH, TOBRAPEAK, TOBRARND, AMIKACINPEAK, AMIKACINTROU, AMIKACIN in the last 72 hours.   Microbiology: Recent Results (from the past 720 hour(s))  Urine culture     Status: None   Collection Time: 11/25/15  6:19 PM  Result Value Ref Range Status   Specimen Description URINE, RANDOM  Final   Special Requests NONE  Final   Culture   Final    >=100,000 COLONIES/mL GRAM NEGATIVE RODS IDENTIFICATION AND SUSCEPTIBILITIES TO FOLLOW    Report Status 11/27/2015 FINAL  Final  Blood Culture (routine x 2)     Status: None (Preliminary result)   Collection Time: 11/25/15  6:22 PM  Result Value Ref Range Status   Specimen Description BLOOD RIGHT ARM  Final   Special Requests BOTTLES DRAWN AEROBIC AND ANAEROBIC  1CC  Final   Culture NO GROWTH < 12 HOURS  Final   Report Status PENDING  Incomplete  Blood Culture (routine x 2)     Status: None (Preliminary result)   Collection Time: 11/25/15  6:23 PM  Result Value Ref Range Status   Specimen Description BLOOD RIGHT AC  Final   Special Requests BOTTLES DRAWN AEROBIC AND ANAEROBIC  1CC   Final   Culture NO GROWTH < 12 HOURS  Final   Report Status PENDING  Incomplete  MRSA PCR Screening     Status: None   Collection Time: 11/25/15  9:10 PM  Result Value Ref Range Status   MRSA by PCR NEGATIVE NEGATIVE Final    Comment:        The GeneXpert MRSA Assay (FDA approved for NASAL specimens only), is one component of a comprehensive MRSA colonization surveillance program. It is not intended to diagnose MRSA infection nor to guide or monitor treatment for MRSA infections.   C difficile quick scan w PCR reflex     Status: Abnormal   Collection Time: 11/27/15  2:13 AM  Result Value Ref Range Status   C Diff antigen POSITIVE (A) NEGATIVE Final   C Diff toxin NEGATIVE NEGATIVE Final   C Diff interpretation   Final    Positive for toxigenic C. difficile, active toxin production not detected. Patient has toxigenic C. difficile organisms present in the bowel, but toxin was not detected. The patient may be a carrier or the level of toxin in the sample was below the limit  of detection. This information should be used in conjunction with the patient's clinical history when deciding on possible therapy.   Clostridium Difficile by PCR     Status: Abnormal   Collection Time: 11/27/15  2:13 AM  Result Value Ref Range Status   Toxigenic C Difficile by pcr POSITIVE (A) NEGATIVE Final  Comment: CRITICAL RESULT CALLED TO, READ BACK BY AND VERIFIED WITH:  FELICIA PEREUDAMME AT 4782 11/27/15 WDM     Medical History: Past Medical History  Diagnosis Date  . Alcoholic cirrhosis (HCC)   . Thrombocytopenia (HCC)   . High serum ferritin   . Hepatorenal syndrome (HCC)     Secondary  . IDA (iron deficiency anemia) 05/24/2015  . Hypertension   . Diabetes mellitus without complication (HCC)     no longer diabetic since 3 yr ago    Medications:  Anti-infectives    Start     Dose/Rate Route Frequency Ordered Stop   11/27/15 2200  piperacillin-tazobactam (ZOSYN) IVPB 3.375 g     3.375  g 12.5 mL/hr over 240 Minutes Intravenous Every 12 hours 11/27/15 0856     11/27/15 1200  vancomycin (VANCOCIN) IVPB 750 mg/150 ml premix     750 mg 150 mL/hr over 60 Minutes Intravenous Every M-W-F (Hemodialysis) 11/25/15 1958     11/27/15 1200  vancomycin (VANCOCIN) 50 mg/mL oral solution 250 mg     250 mg Oral 4 times per day 11/27/15 0918     11/26/15 1000  azithromycin (ZITHROMAX) tablet 250 mg     250 mg Oral Daily 11/25/15 2005 11/30/15 0959   11/26/15 0800  piperacillin-tazobactam (ZOSYN) IVPB 3.375 g  Status:  Discontinued     3.375 g 12.5 mL/hr over 240 Minutes Intravenous 3 times per day 11/26/15 0808 11/27/15 0857   11/26/15 0700  piperacillin-tazobactam (ZOSYN) IVPB 3.375 g  Status:  Discontinued     3.375 g 12.5 mL/hr over 240 Minutes Intravenous Every 12 hours 11/25/15 1958 11/26/15 0808   11/25/15 2200  rifaximin (XIFAXAN) tablet 200 mg     200 mg Oral 2 times daily 11/25/15 2005     11/25/15 2015  azithromycin (ZITHROMAX) tablet 500 mg     500 mg Oral Daily 11/25/15 2005 11/25/15 2049   11/25/15 1930  vancomycin (VANCOCIN) 1,500 mg in sodium chloride 0.9 % 500 mL IVPB     1,500 mg 250 mL/hr over 120 Minutes Intravenous  Once 11/25/15 1930 11/25/15 2227   11/25/15 1900  piperacillin-tazobactam (ZOSYN) IVPB 3.375 g  Status:  Discontinued     3.375 g 100 mL/hr over 30 Minutes Intravenous  Once 11/25/15 1857 11/25/15 1857   11/25/15 1830  piperacillin-tazobactam (ZOSYN) IVPB 3.375 g     3.375 g 100 mL/hr over 30 Minutes Intravenous  Once 11/25/15 1817 11/25/15 2021   11/25/15 1830  vancomycin (VANCOCIN) IVPB 1000 mg/200 mL premix  Status:  Discontinued     1,000 mg 200 mL/hr over 60 Minutes Intravenous  Once 11/25/15 1817 11/25/15 1929     Assessment: 79 yo male admitted with cough, weakness and confusion.  Suspect Left Lower lobe pneumonia.   Currently on dialysis M-W-F. Pharmacy consulted for dosing and monitoring of vancomycina nd Zosyn for treatment of suspected  pneumonia and rule out sepsis. Loading Dose of Vancomycin  given on 11/25/15. Patient started on Zosyn 3.375 q12 hours.   Goal of Therapy:  Vancomycin trough level 15-20 mcg/ml  Plan:  Will continue Vancomycin  after each Dialysis session (currently MWF). Trough scheduled with the 3rd dialysis session on 12/16.  Will change Zosyn to 3.375 g EI q 12 hours.  Recommend narrowing of therapy and discontinuation of vancomycin on 12/12 based on negative MRSA PCR.  Pharmacy will follow up on micro and continue to monitor renal function and labs and make adjustments as needed.  Luisa HartScott Chondra Boyde, PharmD   11/27/2015,11:42 AM

## 2015-11-27 NOTE — Progress Notes (Signed)
Pharmacy Antibiotic Follow-up Note  Mike Booker is a 79 y.o. year-old male admitted on 11/25/2015.  The patient is currently on day 3 of azithromycin, vancomycin, and Zosyn  for PNA and UTI.  Assessment/Plan: After discussion with Dr. Belia HemanKasa, the current antibiotic(s) azithromycin, vancomycin, and Zosyn will be narrowed to ceftriaxone.  Temp (24hrs), Avg:98.4 F (36.9 C), Min:97.3 F (36.3 C), Max:99.1 F (37.3 C)   Recent Labs Lab 11/25/15 1819 11/26/15 0200 11/27/15 0310  WBC 3.9 12.3* 7.9    Recent Labs Lab 11/25/15 1819 11/26/15 0200 11/27/15 0310  CREATININE 2.06* 2.26* 2.41*   Estimated Creatinine Clearance: 24.8 mL/min (by C-G formula based on Cr of 2.41).    No Known Allergies  Antimicrobials this admission: Azithromycin 12/10 >> 12/12 Zosyn 12/10 >> 12/12 Vancomycin 12/10 >> 12/12  Microbiology results: 12/10 BCx: NTD x 2 12/10 UCx: GNR   Sputum: pending  12/10 MRSA PCR: negative  Thank you for allowing pharmacy to be a part of this patient's care.  Luisa HartChristy, Yue Flanigan D PharmD 11/27/2015 2:36 PM

## 2015-11-27 NOTE — Progress Notes (Signed)
[facility] [place] at [facility]   PATIENT NAME: Mike Booker    MR#:  [id number]  DATE OF BIRTH:  10-May-1936  SUBJECTIVE:  CHIEF COMPLAINT:   Chief Complaint  Patient presents with  . Altered Mental Status    More alert now, but still somewhat confised- and don't know why he is here. BP is stable on levophed drip.   Have diarrhea. c diff positive- now contact isolation.  REVIEW OF SYSTEMS:   confusion present.  ROS  DRUG ALLERGIES:  No Known Allergies  VITALS:  Blood pressure 120/72, pulse 84, temperature 98 F (36.7 C), temperature source Oral, resp. rate 26, height  (1.727 m), weight 77.7 kg (171 lb 4.8 oz), SpO2 98 %.  PHYSICAL EXAMINATION:  GENERAL:  79 y.o.-year-old patient lying in the bed with no acute distress.  EYES: Pupils equal, round, reactive to light and accommodation. No scleral icterus. Extraocular muscles intact.  HEENT: Head atraumatic, normocephalic. Oropharynx and nasopharynx clear.  NECK:  Supple, no jugular venous distention. No thyroid enlargement, no tenderness.  LUNGS: Normal breath sounds bilaterally, no wheezing, rales,rhonchi or crepitation. No use of accessory muscles of respiration.  CARDIOVASCULAR: S1, S2 normal. No murmurs, rubs, or gallops.  ABDOMEN: Soft, nontender, mild distended. Bowel sounds present. No organomegaly or mass.  EXTREMITIES: No pedal edema, cyanosis, or clubbing.  NEUROLOGIC: Cranial nerves II through XII are intact. Muscle strength 4/5 in all extremities. Sensation intact. Gait not checked.  PSYCHIATRIC: The patient is alert and oriented x 1.  SKIN: No obvious rash, lesion, or ulcer.   Physical Exam LABORATORY PANEL:   CBC  Recent Labs Lab 11/27/15 0310  WBC 7.9  HGB 8.9*  HCT 26.4*  PLT 87*   ------------------------------------------------------------------------------------------------------------------  Chemistries   Recent Labs Lab 11/25/15 1819  11/27/15 0310  NA  132*  < > 134*  K 2.4*  < > 4.1  CL 100*  < > 108  CO2 24  < > 23  GLUCOSE 158*  < > 128*  BUN 10  < > 16  CREATININE 2.06*  < > 2.41*  CALCIUM 8.2*  < > 7.8*  AST 251*  --   --   ALT 65*  --   --   ALKPHOS 217*  --   --   BILITOT 4.1*  --   --   < > = values in this interval not displayed. ------------------------------------------------------------------------------------------------------------------  Cardiac Enzymes  Recent Labs Lab 11/26/15 0423 11/26/15 0914  TROPONINI 0.03 0.04*   ------------------------------------------------------------------------------------------------------------------  RADIOLOGY:  No results found.  ASSESSMENT AND PLAN:   Active Problems:   Sepsis (HCC)  1. sepsis due to left lower lobe pneumonia.   sent blood cultures , sputum cultures if possible,  on vancomycin and Zosyn intravenously.   changed to IV rocephin, Following culture results 2. Left lower lobe pneumonia due to unknown infectious agent at present,   continue antibiotics, broad-spectrum, awaited sputum cultures and adjust antibiotics depending on culture results   Appreciated pulmonary help. 3. urinary tract infection,   urinary cultures. Continue rocephin antibiotic therapy 4. Hypotension,    Due to sepsis- on levophed drip.   5. Hypokalemia, likely due to intermittent diarrhea, supplement orally as well as intravenously 6. End staging renal disease,    nephrologist involved for further recommendations. Hemodialysis per schedule   On peritoneal dialysis at home. 7. Hyponatremia, likely due to intravascular volume depletion.follow with IV fluid administration 8. Metabolic encephalopathy, likely due to infection as  well as hepatic encephalopathy. Initiate patient on Xifaxan, there may be some underlying  9. Normal EKG, check cardiac enzymes 3, unable to initiate patient on beta blockers due to hypotension 10. c diff Diarrhea - started oral vancomycin.   All the  records are reviewed and case discussed with Care Management/Social Workerr. Management plans discussed with the patient, family and they are in agreement.  CODE STATUS: Full TOTAL TIME TAKING CARE OF THIS PATIENT: 40 critical care minutes.   I called his daughter- and updated her about the condition.  POSSIBLE D/C IN 2-3 DAYS, DEPENDING ON CLINICAL CONDITION.   Altamese DillingVACHHANI, Mike Booker M.D on 11/27/2015   Between 7am to 6pm - Pager - [phone]  After 6pm go to [email or web address] - password [email or web address] [facility]  [facility]  Office  [phone]  CC: Primary care physician; Clydie BraunFITZGERALD, DAVID, MD  Note: This dictation was prepared with Dragon dictation along with smaller phrase technology. Any transcriptional errors that result from this process are unintentional.

## 2015-11-27 NOTE — Progress Notes (Signed)
[facility] [place] [facility] - [place] at [facility] [place]   PATIENT NAME: Mike Booker    MR#:  [id number]  DATE OF BIRTH:  [birth year]  SUBJECTIVE:  CHIEF COMPLAINT:   Chief Complaint  Patient presents with  . Altered Mental Status    More alert now, but still somewhat confised- and don't know why he is here. BP is stable on levophed drip.   Have diarrhea.  REVIEW OF SYSTEMS:   confusion present.  ROS  DRUG ALLERGIES:  No Known Allergies  VITALS:  Blood pressure 97/51, pulse 66, temperature 97.9 F (36.6 C), temperature source Rectal, resp. rate 22, height  (1.727 m), weight 74.2 kg (163 lb 9.3 oz), SpO2 100 %.  PHYSICAL EXAMINATION:  GENERAL:  79 y.o.-year-old patient lying in the bed with no acute distress.  EYES: Pupils equal, round, reactive to light and accommodation. No scleral icterus. Extraocular muscles intact.  HEENT: Head atraumatic, normocephalic. Oropharynx and nasopharynx clear.  NECK:  Supple, no jugular venous distention. No thyroid enlargement, no tenderness.  LUNGS: Normal breath sounds bilaterally, no wheezing, rales,rhonchi or crepitation. No use of accessory muscles of respiration.  CARDIOVASCULAR: S1, S2 normal. No murmurs, rubs, or gallops.  ABDOMEN: Soft, nontender, mild distended. Bowel sounds present. No organomegaly or mass.  EXTREMITIES: No pedal edema, cyanosis, or clubbing.  NEUROLOGIC: Cranial nerves II through XII are intact. Muscle strength 4/5 in all extremities. Sensation intact. Gait not checked.  PSYCHIATRIC: The patient is alert and oriented x 1.  SKIN: No obvious rash, lesion, or ulcer.   Physical Exam LABORATORY PANEL:   CBC  Recent Labs Lab 11/27/15 0310  WBC 7.9  HGB 8.9*  HCT 26.4*  PLT 87*   ------------------------------------------------------------------------------------------------------------------  Chemistries   Recent Labs Lab 11/25/15 1819  11/27/15 0310  NA 132*  < > 134*  K 2.4*  < > 4.1  CL  100*  < > 108  CO2 24  < > 23  GLUCOSE 158*  < > 128*  BUN 10  < > 16  CREATININE 2.06*  < > 2.41*  CALCIUM 8.2*  < > 7.8*  AST 251*  --   --   ALT 65*  --   --   ALKPHOS 217*  --   --   BILITOT 4.1*  --   --   < > = values in this interval not displayed. ------------------------------------------------------------------------------------------------------------------  Cardiac Enzymes  Recent Labs Lab 11/26/15 0423 11/26/15 0914  TROPONINI 0.03 0.04*   ------------------------------------------------------------------------------------------------------------------  RADIOLOGY:  Dg Chest Port 1 View  11/25/2015  CLINICAL DATA:  79 year old with acute mental status changes, fever, and urine with a strong smell that began yesterday. Patient began hemodialysis 1 month ago. EXAM: PORTABLE CHEST 1 VIEW COMPARISON:  None. FINDINGS: Right jugular dialysis catheter tips project over the lower SVC. Cardiac silhouette markedly enlarged. Pulmonary vascularity normal without evidence pulmonary edema. Dense airspace consolidation in the left lower lobe. Bilateral pleural effusions suspected. IMPRESSION: 1. Dense left lower lobe atelectasis and/or pneumonia. 2. Marked cardiomegaly without evidence of pulmonary edema. 3. Bilateral pleural effusions suspected. Electronically Signed   By: Hulan Saas M.D.   On: 11/25/2015 18:44    ASSESSMENT AND PLAN:   Active Problems:   Sepsis (HCC)  1. sepsis due to left lower lobe pneumonia.   sent blood cultures , sputum cultures if possible,  on vancomycin and Zosyn intravenously. Following culture results 2. Left lower lobe pneumonia due to unknown infectious agent at present,   continue  antibiotics, broad-spectrum, get sputum cultures and adjust antibiotics depending on culture results   Appreciated pulmonary help. 3. urinary tract infection,   urinary cultures. Continue broad-spectrum antibiotic therapy 4. Hypotension,    Due to sepsis- on  levophed drip.   5. Hypokalemia, likely due to intermittent diarrhea, supplement orally as well as intravenously 6. End staging renal disease,    nephrologist involved for further recommendations. Hemodialysis per schedule 7. Hyponatremia, likely due to intravascular volume depletion.follow with IV fluid administration 8. Metabolic encephalopathy, likely due to infection as well as hepatic encephalopathy. Initiate patient on Xifaxan 9. Normal EKG, check cardiac enzymes 3, unable to initiate patient on beta blockers due to hypotension 10. Diarrhea - check for C diff.   All the records are reviewed and case discussed with Care Management/Social Workerr. Management plans discussed with the patient, family and they are in agreement.  CODE STATUS: Full TOTAL TIME TAKING CARE OF THIS PATIENT: 40 critical care minutes.   I called his daughter- and updated her about the condition.  POSSIBLE D/C IN 2-3 DAYS, DEPENDING ON CLINICAL CONDITION.   Altamese DillingVACHHANI, Jorrell Kuster M.D on 11/27/2015   Between 7am to 6pm - Pager - [phone]  After 6pm go to [email or web address] - password [email or web address] [facility]  [facility]  Office  [phone]  CC: Primary care physician; Clydie BraunFITZGERALD, DAVID, MD  Note: This dictation was prepared with Dragon dictation along with smaller phrase technology. Any transcriptional errors that result from this process are unintentional.

## 2015-11-27 NOTE — Progress Notes (Signed)
Subjective:  Patient due for dialysis today. Permcath still being used for infusions.  Case discussed with Dr. Belia HemanKasa.   States he's still feeling weak overall.  Objective:  Vital signs in last 24 hours:  Temp:  [97.3 F (36.3 C)-99.1 F (37.3 C)] 97.8 F (36.6 C) (12/12 0800) Pulse Rate:  [37-108] 100 (12/12 0800) Resp:  [17-25] 17 (12/12 0800) BP: (60-103)/(39-69) 100/50 mmHg (12/12 0800) SpO2:  [94 %-100 %] 100 % (12/12 0800)  Weight change:  Filed Weights   11/25/15 1816  Weight: 74.2 kg (163 lb 9.3 oz)    Intake/Output:    Intake/Output Summary (Last 24 hours) at 11/27/15 [id number] Last data filed at 11/27/15 0900  Gross per 24 hour  Intake 1486.95 ml  Output    165 ml  Net 1321.95 ml     Physical Exam: General: NAD, laying in bed  HEENT Moist oral mucus membranes hearing intact  Neck supple  Pulm/lungs Clear b/l, normal resp effort  CVS/Heart Irregular, soft systolic murmur  Abdomen:  Soft, NTND, BS present  Extremities: trace peripheral edema  Neurologic: Awake, alert, conversant  Skin: Bruises b/l UE's  Access: Rt IJ PC, Left arm AVF developing       Basic Metabolic Panel:   Recent Labs Lab 11/25/15 1819 11/25/15 2220 11/26/15 0200 11/27/15 0310  NA 132*  --  133* 134*  K 2.4* 2.4* 3.1* 4.1  CL 100*  --  106 108  CO2 24  --  22 23  GLUCOSE 158*  --  311* 128*  BUN 10  --  12 16  CREATININE 2.06*  --  2.26* 2.41*  CALCIUM 8.2*  --  7.3* 7.8*  PHOS  --   --   --  <1.0*     CBC:  Recent Labs Lab 11/25/15 1819 11/26/15 0200 11/27/15 0310  WBC 3.9 12.3* 7.9  NEUTROABS 3.3  --   --   HGB 9.5* 8.6* 8.9*  HCT 28.0* 26.0* 26.4*  MCV 110.1* 111.4* 109.8*  PLT 89* 100* 87*      Microbiology:  Recent Results (from the past 720 hour(s))  Urine culture     Status: None   Collection Time: 11/25/15  6:19 PM  Result Value Ref Range Status   Specimen Description URINE, RANDOM  Final   Special Requests NONE  Final   Culture   Final     >=100,000 COLONIES/mL GRAM NEGATIVE RODS IDENTIFICATION AND SUSCEPTIBILITIES TO FOLLOW    Report Status 11/27/2015 FINAL  Final  Blood Culture (routine x 2)     Status: None (Preliminary result)   Collection Time: 11/25/15  6:22 PM  Result Value Ref Range Status   Specimen Description BLOOD RIGHT ARM  Final   Special Requests BOTTLES DRAWN AEROBIC AND ANAEROBIC  1CC  Final   Culture NO GROWTH < 12 HOURS  Final   Report Status PENDING  Incomplete  Blood Culture (routine x 2)     Status: None (Preliminary result)   Collection Time: 11/25/15  6:23 PM  Result Value Ref Range Status   Specimen Description BLOOD RIGHT AC  Final   Special Requests BOTTLES DRAWN AEROBIC AND ANAEROBIC  1CC  Final   Culture NO GROWTH < 12 HOURS  Final   Report Status PENDING  Incomplete  MRSA PCR Screening     Status: None   Collection Time: 11/25/15  9:10 PM  Result Value Ref Range Status   MRSA by PCR NEGATIVE NEGATIVE Final  Comment:        The GeneXpert MRSA Assay (FDA approved for NASAL specimens only), is one component of a comprehensive MRSA colonization surveillance program. It is not intended to diagnose MRSA infection nor to guide or monitor treatment for MRSA infections.   C difficile quick scan w PCR reflex     Status: Abnormal   Collection Time: 11/27/15  2:13 AM  Result Value Ref Range Status   C Diff antigen POSITIVE (A) NEGATIVE Final   C Diff toxin NEGATIVE NEGATIVE Final   C Diff interpretation   Final    Positive for toxigenic C. difficile, active toxin production not detected. Patient has toxigenic C. difficile organisms present in the bowel, but toxin was not detected. The patient may be a carrier or the level of toxin in the sample was below the limit  of detection. This information should be used in conjunction with the patient's clinical history when deciding on possible therapy.   Clostridium Difficile by PCR     Status: Abnormal   Collection Time: 11/27/15  2:13 AM   Result Value Ref Range Status   Toxigenic C Difficile by pcr POSITIVE (A) NEGATIVE Final    Comment: CRITICAL RESULT CALLED TO, READ BACK BY AND VERIFIED WITH:  FELICIA PEREUDAMME AT 0429 11/27/15 WDM     Coagulation Studies: No results for input(s): LABPROT, INR in the last 72 hours.  Urinalysis:  Recent Labs  11/25/15 1819  COLORURINE AMBER*  LABSPEC 1.017  PHURINE 5.0  GLUCOSEU NEGATIVE  HGBUR 2+*  BILIRUBINUR 1+*  KETONESUR NEGATIVE  PROTEINUR 30*  NITRITE NEGATIVE  LEUKOCYTESUR 2+*      Imaging: Dg Chest Port 1 View  11/25/2015  CLINICAL DATA:  79 year old with acute mental status changes, fever, and urine with a strong smell that began yesterday. Patient began hemodialysis 1 month ago. EXAM: PORTABLE CHEST 1 VIEW COMPARISON:  None. FINDINGS: Right jugular dialysis catheter tips project over the lower SVC. Cardiac silhouette markedly enlarged. Pulmonary vascularity normal without evidence pulmonary edema. Dense airspace consolidation in the left lower lobe. Bilateral pleural effusions suspected. IMPRESSION: 1. Dense left lower lobe atelectasis and/or pneumonia. 2. Marked cardiomegaly without evidence of pulmonary edema. 3. Bilateral pleural effusions suspected. Electronically Signed   By: Hulan Saas M.D.   On: 11/25/2015 18:44     Medications:   . 0.9 % NaCl with KCl 20 mEq / L Stopped (11/26/15 0923)  . phenylephrine (NEO-SYNEPHRINE) Adult infusion 80 mcg/min (11/27/15 0641)   . azithromycin  250 mg Oral Daily  . heparin  5,000 Units Subcutaneous 3 times per day  . mirtazapine  15 mg Oral QHS  . multivitamin with minerals  1 tablet Oral Q supper  . pantoprazole  40 mg Oral Q breakfast  . piperacillin-tazobactam (ZOSYN)  IV  3.375 g Intravenous Q12H  . potassium chloride  40 mEq Oral BID  . rifaximin  200 mg Oral BID  . sodium chloride  3 mL Intravenous Q12H  . sodium phosphate  Dextrose 5% IVPB  20 mmol Intravenous Once  . vancomycin  250 mg Oral 4  times per day  . vancomycin  750 mg Intravenous Q M,W,F-HD   acetaminophen **OR** acetaminophen, loperamide  Assessment/ Plan:  79 y.o. male with ESRD,  h/o alcohol abuse, pancytopenia, hepatic cirrhosis    HD: MWF [facility]/Dr Kolluru  1. ESRD on HD MWF.  - Pt seen at bedside, due for HD today, orders have been prepared, discussed obtaining alternative access  for infusions with Dr. Belia Heman.  No UF today given hypotension.  2. Anemia of CKD: hgb 8.9, start epogen 10000 units IV with HD.   3. SHPTH of renal origin:  -Patient actually on NaPhos now, low phos likely indicates poor underlying nutritional status  4.Fever with lactic acidosis/hypotension- Likely sepsis from UTI - Gram negative rods in the urine, abx therapy per pulm/cc.   -  Discussed obtaining CVL with Dr. Belia Heman this AM.  -  Pressors per pulm/cc.    LOS: 2 Sequoya Hogsett 12/12/20169:42 AM

## 2015-11-27 NOTE — Care Management (Signed)
It appears [facility].  He is not available to speak with CM at present time.  It appears he is relatively recent esrd and receives dialysis [facility].  He is currently being treated for urinary sepsis and on pressors.  Notified dialysis coordinator of admission and left message at [facility] to determine level of care

## 2015-11-28 ENCOUNTER — Inpatient Hospital Stay: Payer: Medicare Other

## 2015-11-28 DIAGNOSIS — G9340 Encephalopathy, unspecified: Secondary | ICD-10-CM

## 2015-11-28 LAB — RENAL FUNCTION PANEL
Albumin: 1.5 g/dL — ABNORMAL LOW (ref 3.5–5.0)
Anion gap: 10 (ref 5–15)
BUN: 11 mg/dL (ref 6–20)
CALCIUM: 7.4 mg/dL — AB (ref 8.9–10.3)
CHLORIDE: 98 mmol/L — AB (ref 101–111)
CO2: 27 mmol/L (ref 22–32)
CREATININE: 1.87 mg/dL — AB (ref 0.61–1.24)
GFR calc Af Amer: 38 mL/min — ABNORMAL LOW (ref >60)
GFR calc non Af Amer: 33 mL/min — ABNORMAL LOW (ref >60)
GLUCOSE: 252 mg/dL — AB (ref 65–99)
Phosphorus: 2.2 mg/dL — ABNORMAL LOW (ref 2.5–4.6)
Potassium: 4.7 mmol/L (ref 3.5–5.1)
SODIUM: 135 mmol/L (ref 135–145)

## 2015-11-28 LAB — URINE CULTURE

## 2015-11-28 LAB — CBC
HCT: 28.8 % — ABNORMAL LOW (ref 40.0–52.0)
HCT: 21.8 % — ABNORMAL LOW (ref 40.0–52.0)
HEMOGLOBIN: 9.7 g/dL — AB (ref 13.0–18.0)
Hemoglobin: 7.1 g/dL — ABNORMAL LOW (ref 13.0–18.0)
MCH: 37.2 pg — ABNORMAL HIGH (ref 26.0–34.0)
MCH: 35.8 pg — AB (ref 26.0–34.0)
MCHC: 33.6 g/dL (ref 32.0–36.0)
MCHC: 32.8 g/dL (ref 32.0–36.0)
MCV: 110.5 fL — ABNORMAL HIGH (ref 80.0–100.0)
MCV: 109.2 fL — AB (ref 80.0–100.0)
PLATELETS: 76 10*3/uL — AB (ref 150–440)
PLATELETS: 49 10*3/uL — AB (ref 150–440)
RBC: 2.61 MIL/uL — AB (ref 4.40–5.90)
RBC: 1.99 MIL/uL — ABNORMAL LOW (ref 4.40–5.90)
RDW: 18.2 % — ABNORMAL HIGH (ref 11.5–14.5)
RDW: 17.9 % — ABNORMAL HIGH (ref 11.5–14.5)
WBC: 5.3 10*3/uL (ref 3.8–10.6)
WBC: 2.2 10*3/uL — ABNORMAL LOW (ref 3.8–10.6)

## 2015-11-28 LAB — BASIC METABOLIC PANEL
Anion gap: 8 (ref 5–15)
BUN: 9 mg/dL (ref 6–20)
CHLORIDE: 102 mmol/L (ref 101–111)
CO2: 25 mmol/L (ref 22–32)
CREATININE: 1.74 mg/dL — AB (ref 0.61–1.24)
Calcium: 8 mg/dL — ABNORMAL LOW (ref 8.9–10.3)
GFR, EST AFRICAN AMERICAN: 42 mL/min — AB (ref >60)
GFR, EST NON AFRICAN AMERICAN: 36 mL/min — AB (ref >60)
Glucose, Bld: 298 mg/dL — ABNORMAL HIGH (ref 65–99)
Potassium: 5.4 mmol/L — ABNORMAL HIGH (ref 3.5–5.1)
SODIUM: 135 mmol/L (ref 135–145)

## 2015-11-28 LAB — PHOSPHORUS
PHOSPHORUS: 2.1 mg/dL — AB (ref 2.5–4.6)
PHOSPHORUS: 2.1 mg/dL — AB (ref 2.5–4.6)

## 2015-11-28 LAB — BLOOD GAS, ARTERIAL
ACID-BASE EXCESS: 3.2 mmol/L — AB (ref 0.0–3.0)
Allens test (pass/fail): POSITIVE — AB
Bicarbonate: 26.8 mEq/L (ref 21.0–28.0)
FIO2: 21
O2 SAT: 93.9 %
PATIENT TEMPERATURE: 37
PCO2 ART: 36 mmHg (ref 32.0–48.0)
pH, Arterial: 7.48 — ABNORMAL HIGH (ref 7.350–7.450)
pO2, Arterial: 65 mmHg — ABNORMAL LOW (ref 83.0–108.0)

## 2015-11-28 LAB — HEPATITIS B SURFACE ANTIGEN: HEP B S AG: NEGATIVE

## 2015-11-28 LAB — HEPATITIS B SURFACE ANTIBODY, QUANTITATIVE

## 2015-11-28 LAB — MAGNESIUM: Magnesium: 1.6 mg/dL — ABNORMAL LOW (ref 1.7–2.4)

## 2015-11-28 LAB — AMMONIA: AMMONIA: 19 umol/L (ref 9–35)

## 2015-11-28 MED ORDER — FREE WATER
25.0000 mL | Status: DC
  Administered 2015-11-28 – 2015-11-29 (×6): 25 mL

## 2015-11-28 MED ORDER — SODIUM POLYSTYRENE SULFONATE 15 GM/60ML PO SUSP: 30.0000 g | Freq: Once | ORAL | Status: DC

## 2015-11-28 MED ORDER — PRO-STAT SUGAR FREE PO LIQD
30.0000 mL | Freq: Every day | ORAL | Status: DC
  Administered 2015-11-28 – 2015-11-29 (×2): 30 mL

## 2015-11-28 MED ORDER — CETYLPYRIDINIUM CHLORIDE 0.05 % MT LIQD
7.0000 mL | Freq: Two times a day (BID) | OROMUCOSAL | Status: DC
Start: 2015-11-28 — End: 2015-11-29
  Administered 2015-11-28 – 2015-11-29 (×2): 7 mL via OROMUCOSAL

## 2015-11-28 MED ORDER — MAGNESIUM SULFATE 2 GM/50ML IV SOLN
2.0000 g | Freq: Once | INTRAVENOUS | Status: AC
  Administered 2015-11-28: 2 g via INTRAVENOUS
  Filled 2015-11-28: qty 50

## 2015-11-28 MED ORDER — DEXTROSE 5 % IV SOLN
10.0000 mmol | Freq: Once | INTRAVENOUS | Status: AC
  Administered 2015-11-28: 10 mmol via INTRAVENOUS
  Filled 2015-11-28: qty 3.33

## 2015-11-28 MED ORDER — SODIUM PHOSPHATE 3 MMOLE/ML IV SOLN
10.0000 mmol | Freq: Once | INTRAVENOUS | Status: DC
  Filled 2015-11-28: qty 3.33

## 2015-11-28 MED ORDER — RIFAXIMIN 200 MG PO TABS
400.0000 mg | ORAL_TABLET | Freq: Three times a day (TID) | ORAL | Status: DC
  Administered 2015-11-28 – 2015-11-29 (×3): 400 mg via ORAL
  Filled 2015-11-28 (×3): qty 2

## 2015-11-28 MED ORDER — SPIRITUS FRUMENTI
1.0000 | Freq: Two times a day (BID) | ORAL | Status: DC
  Administered 2015-11-28 – 2015-11-29 (×3): 1 via ORAL
  Filled 2015-11-28 (×5): qty 1

## 2015-11-28 MED ORDER — NEPRO/CARBSTEADY PO LIQD
237.0000 mL | ORAL | Status: DC
  Administered 2015-11-28: 237 mL via ORAL

## 2015-11-28 NOTE — Progress Notes (Signed)
Inpatient Diabetes Program Recommendations  AACE/ADA: New Consensus Statement on Inpatient Glycemic Control (2015)  Target Ranges:  Prepandial:   less than 140 mg/dL      Peak postprandial:   less than 180 mg/dL (1-2 hours)      Critically ill patients:  140 - 180 mg/dL  Results for Mike FarrMORRIS, Mike Booker (MRN [id number]) as of 11/28/2015 09:35  Ref. Range 11/25/2015 18:19 11/26/2015 02:00 11/27/2015 03:10 11/28/2015 05:15  Glucose Latest Ref Range: 65-99 mg/dL 841158 (H) 324311 (H) 401128 (H) 298 (H)   Review of Glycemic Control  Diabetes history: No Outpatient Diabetes medications: NA Current orders for Inpatient glycemic control: None  Inpatient Diabetes Program Recommendations: Correction (SSI): Patient is ordered Solumedrol 50 mg Q6H which is likely cause of hyperglycemia. Lab glucose 298 mg/dl this morning at 0:275:15. Please consider ordering CBGs with Novolog sensitive correction scale.  Thanks, Orlando PennerMarie Shontel Santee, RN, MSN, CDE Diabetes Coordinator Inpatient Diabetes Program [phone] (Team Pager from 8am to 5pm) [phone] ([facility] office) [phone] [facility] office) [phone] [facility] office)'

## 2015-11-28 NOTE — Progress Notes (Signed)
This is to notify that Mr. Mike Booker , Date of Birth- January 6th, 1937; is admitted in ICU at [facility] [place].  For further information please contact hospital.  Purpose of this note- to help in visa process of patient's daughter on family's request.  -Dr.Yamari Ventola [facility] [phone].

## 2015-11-28 NOTE — Progress Notes (Signed)
Placement of NG tube was verified via X-Ray and permission to use was given by Dr Belia HemanKasa.

## 2015-11-28 NOTE — Progress Notes (Signed)
Dr Elisabeth PigeonVachhani notified of decreased electrolytes phos and mag, wbc, platelets and hgb. Orders to consult pharmacy for electrolyte replacement and repeat CBC in am. No additional orders given at this time.

## 2015-11-28 NOTE — Consult Note (Signed)
After discussion with Daughter WIfe, they had stated that patient has progressive cognitive/physical decline over past several months  Patient with ESRD with Liver cirrhosis now with DT's  Family has stated that patient would NOT want chest compressions, shock therapy, Ventilator. Patient will be DNR/DNI

## 2015-11-28 NOTE — Progress Notes (Signed)
[facility] [place] [facility]   PATIENT NAME: Mike Booker    MR#:  [id number]  DATE OF BIRTH:  [birth year]  SUBJECTIVE:  CHIEF COMPLAINT:   Chief Complaint  Patient presents with  . Altered Mental Status   Drowsy today, going through alcohol withdrawal. Remains on vasopressors.  Family decided on DNR.  REVIEW OF SYSTEMS:   confusion present.  ROS  DRUG ALLERGIES:  No Known Allergies  VITALS:  Blood pressure 89/77, pulse 71, temperature 98 F (36.7 C), temperature source Axillary, resp. rate 26, height  (1.727 m), weight 77.7 kg (171 lb 4.8 oz), SpO2 100 %.  PHYSICAL EXAMINATION:  GENERAL:  79 y.o.-year-old patient lying in the bed with no acute distress. Drowsy. EYES: Pupils equal, round, reactive to light and accommodation. No scleral icterus.  HEENT: Head atraumatic, normocephalic. Oropharynx and nasopharynx clear.  NECK:  Supple, no jugular venous distention. No thyroid enlargement, no tenderness.  LUNGS: Normal breath sounds bilaterally, no wheezing, rales,rhonchi or crepitation. No use of accessory muscles of respiration.  CARDIOVASCULAR: S1, S2 normal. No murmurs, rubs, or gallops.  ABDOMEN: Soft, nontender, mild distended. Bowel sounds present. No organomegaly or mass.  EXTREMITIES: No pedal edema, cyanosis, or clubbing.  NEUROLOGIC: pt is drowsy- opens eyes to stimuli, not following commands. PSYCHIATRIC: The patient is drowsy  SKIN: No obvious rash, lesion, or ulcer.   Physical Exam LABORATORY PANEL:   CBC  Recent Labs Lab 11/28/15 0515  WBC 5.3  HGB 9.7*  HCT 28.8*  PLT 76*   ------------------------------------------------------------------------------------------------------------------  Chemistries   Recent Labs Lab 11/25/15 1819  11/28/15 0515  NA 132*  < > 135  K 2.4*  < > 5.4*  CL 100*  < > 102  CO2 24  < > 25  GLUCOSE 158*  < > 298*  BUN 10  < > 9  CREATININE 2.06*  < > 1.74*  CALCIUM 8.2*  < >  8.0*  AST 251*  --   --   ALT 65*  --   --   ALKPHOS 217*  --   --   BILITOT 4.1*  --   --   < > = values in this interval not displayed. ------------------------------------------------------------------------------------------------------------------  Cardiac Enzymes  Recent Labs Lab 11/26/15 0423 11/26/15 0914  TROPONINI 0.03 0.04*   ------------------------------------------------------------------------------------------------------------------  RADIOLOGY:  Dg Abd Portable 1v  11/28/2015  CLINICAL DATA:  Encounter for NG tube placement. EXAM: PORTABLE ABDOMEN - 1 VIEW COMPARISON:  None. FINDINGS: The heart is mildly enlarged. Bilateral pleural effusions and basilar airspace disease are present. The small bore feeding tube terminates within the antrum of the stomach. IMPRESSION: A small bore feeding tube terminates in the antrum of the stomach. Bilateral Pleural effusions and basilar airspace disease are noted. This likely reflects atelectasis. Electronically Signed   By: Marin Roberts M.D.   On: 11/28/2015 14:05    ASSESSMENT AND PLAN:   Active Problems:   Sepsis (HCC)  * sepsis due to left lower lobe pneumonia.   sent blood cultures , sputum cultures if possible,  on vancomycin and Zosyn intravenously.   changed to IV rocephin, Following culture results   * Left lower lobe pneumonia due to unknown infectious agent at present,   continue antibiotics, broad-spectrum, awaited sputum cultures and adjust antibiotics depending on culture results   Appreciated pulmonary help.  * Altered mental status- metabolic encephalopathy due to Infection and alcohol withdrawal.   Pulm suggested intubation, but family decided DNR-  so now will call palliative care consult   Check Ammonia level.  * urinary tract infection,    urinary cultures. Continue rocephin antibiotic therapy  *  Hypotension,    Due to sepsis- on levophed drip.    * Hypokalemia, likely due to intermittent  diarrhea, supplement orally as well as intravenously     Now hyperkalemia- manage with HD.  * End staging renal disease,    nephrologist involved for further recommendations. Hemodialysis per schedule   On peritoneal dialysis at home. * Hyponatremia, likely due to intravascular volume depletion.follow with IV fluid administration * Metabolic encephalopathy, likely due to infection as well as hepatic encephalopathy. Initiate patient on Xifaxan, there may be some underlying  * Normal EKG, check cardiac enzymes 3, unable to initiate patient on beta blockers due to hypotension 1* c diff Diarrhea - started oral vancomycin.   All the records are reviewed and case discussed with Care Management/Social Workerr. Management plans discussed with the patient, family and they are in agreement.  CODE STATUS: Full TOTAL TIME TAKING CARE OF THIS PATIENT: 40 critical care minutes.  Prognosis very poor- called palliative care.  POSSIBLE D/C IN 2-3 DAYS, DEPENDING ON CLINICAL CONDITION.   Altamese DillingVACHHANI, Bradley [facility]yside M.D on 11/28/2015   Between 7am to 6pm - Pager - [phone]  After 6pm go to [email or web address] - password [email or web address] [facility]  [facility] [place] [facility] [facility]  Office  [phone]  CC: Primary care physician; Clydie BraunFITZGERALD, DAVID, MD  Note: This dictation was prepared with Dragon dictation along with smaller phrase technology. Any transcriptional errors that result from this process are unintentional.

## 2015-11-28 NOTE — Progress Notes (Signed)
Subjective:  Worsening status today. Probable DT's noted. Pt lethargic at present.  Dr. Belia HemanKasa considering intubation. Difficult to arouse pt at the moment.  Had HD yesterday. Permcath still being used for infusion.   Objective:  Vital signs in last 24 hours:  Temp:  [97.6 F (36.4 C)-99.9 F (37.7 C)] 98 F (36.7 C) (12/13 0800) Pulse Rate:  [65-130] 65 (12/13 0600) Resp:  [18-33] 27 (12/13 0600) BP: (68-141)/(29-123) 100/65 mmHg (12/13 0600) SpO2:  [92 %-100 %] 100 % (12/13 0600) Weight:  [77.7 kg (171 lb 4.8 oz)] 77.7 kg (171 lb 4.8 oz) (12/12 1025)  Weight change:  Filed Weights   11/25/15 1816 11/27/15 1025  Weight: 74.2 kg (163 lb 9.3 oz) 77.7 kg (171 lb 4.8 oz)    Intake/Output:    Intake/Output Summary (Last 24 hours) at 11/28/15 0916 Last data filed at 11/28/15 0600  Gross per 24 hour  Intake 844.24 ml  Output   -200 ml  Net 1044.24 ml     Physical Exam: General: NAD, laying in bed  HEENT Moist oral mucus membranes hearing intact  Neck supple  Pulm/lungs Clear b/l, normal resp effort  CVS/Heart S1S2 no rubs  Abdomen:  Soft, NTND, BS present  Extremities: trace peripheral edema  Neurologic: Lethargic, difficult to arouse  Skin: Bruises b/l UE's  Access: Rt IJ PC, Left arm AVF developing       Basic Metabolic Panel:   Recent Labs Lab 11/25/15 1819 11/25/15 2220 11/26/15 0200 11/27/15 0310 11/28/15 0515  NA 132*  --  133* 134* 135  K 2.4* 2.4* 3.1* 4.1 5.4*  CL 100*  --  106 108 102  CO2 24  --  22 23 25   GLUCOSE 158*  --  311* 128* 298*  BUN 10  --  12 16 9   CREATININE 2.06*  --  2.26* 2.41* 1.74*  CALCIUM 8.2*  --  7.3* 7.8* 8.0*  PHOS  --   --   --  <1.0*  --      CBC:  Recent Labs Lab 11/25/15 1819 11/26/15 0200 11/27/15 0310 11/28/15 0515  WBC 3.9 12.3* 7.9 5.3  NEUTROABS 3.3  --   --   --   HGB 9.5* 8.6* 8.9* 9.7*  HCT 28.0* 26.0* 26.4* 28.8*  MCV 110.1* 111.4* 109.8* 110.5*  PLT 89* 100* 87* 76*       Microbiology:  Recent Results (from the past 720 hour(s))  Urine culture     Status: None   Collection Time: 11/25/15  6:19 PM  Result Value Ref Range Status   Specimen Description URINE, RANDOM  Final   Special Requests NONE  Final   Culture   Final    >=100,000 COLONIES/mL GRAM NEGATIVE RODS IDENTIFICATION AND SUSCEPTIBILITIES TO FOLLOW    Report Status 11/27/2015 FINAL  Final  Blood Culture (routine x 2)     Status: None (Preliminary result)   Collection Time: 11/25/15  6:22 PM  Result Value Ref Range Status   Specimen Description BLOOD RIGHT ARM  Final   Special Requests BOTTLES DRAWN AEROBIC AND ANAEROBIC  1CC  Final   Culture NO GROWTH 3 DAYS  Final   Report Status PENDING  Incomplete  Blood Culture (routine x 2)     Status: None (Preliminary result)   Collection Time: 11/25/15  6:23 PM  Result Value Ref Range Status   Specimen Description BLOOD RIGHT AC  Final   Special Requests BOTTLES DRAWN AEROBIC AND ANAEROBIC  1CC  Final   Culture NO GROWTH 3 DAYS  Final   Report Status PENDING  Incomplete  MRSA PCR Screening     Status: None   Collection Time: 11/25/15  9:10 PM  Result Value Ref Range Status   MRSA by PCR NEGATIVE NEGATIVE Final    Comment:        The GeneXpert MRSA Assay (FDA approved for NASAL specimens only), is one component of a comprehensive MRSA colonization surveillance program. It is not intended to diagnose MRSA infection nor to guide or monitor treatment for MRSA infections.   C difficile quick scan w PCR reflex     Status: Abnormal   Collection Time: 11/27/15  2:13 AM  Result Value Ref Range Status   C Diff antigen POSITIVE (A) NEGATIVE Final   C Diff toxin NEGATIVE NEGATIVE Final   C Diff interpretation   Final    Positive for toxigenic C. difficile, active toxin production not detected. Patient has toxigenic C. difficile organisms present in the bowel, but toxin was not detected. The patient may be a carrier or the level of toxin in  the sample was below the limit  of detection. This information should be used in conjunction with the patient's clinical history when deciding on possible therapy.   Clostridium Difficile by PCR     Status: Abnormal   Collection Time: 11/27/15  2:13 AM  Result Value Ref Range Status   Toxigenic C Difficile by pcr POSITIVE (A) NEGATIVE Final    Comment: CRITICAL RESULT CALLED TO, READ BACK BY AND VERIFIED WITH:  FELICIA PEREUDAMME AT 0429 11/27/15 WDM     Coagulation Studies: No results for input(s): LABPROT, INR in the last 72 hours.  Urinalysis:  Recent Labs  11/25/15 1819  COLORURINE AMBER*  LABSPEC 1.017  PHURINE 5.0  GLUCOSEU NEGATIVE  HGBUR 2+*  BILIRUBINUR 1+*  KETONESUR NEGATIVE  PROTEINUR 30*  NITRITE NEGATIVE  LEUKOCYTESUR 2+*      Imaging: No results found.   Medications:   . norepinephrine (LEVOPHED) Adult infusion 4 mcg/min (11/28/15 0855)  . vasopressin (PITRESSIN) infusion - *FOR SHOCK* 0.03 Units/min (11/27/15 1730)   . ALPRAZolam  1 mg Oral TID  . cefTRIAXone (ROCEPHIN)  IV  1 g Intravenous Q24H  . epoetin (EPOGEN/PROCRIT) injection  10,000 Units Intravenous Q M,W,F-HD  . heparin  5,000 Units Subcutaneous 3 times per day  . hydrocortisone sod succinate (SOLU-CORTEF) inj  50 mg Intravenous Q6H  . mirtazapine  15 mg Oral QHS  . multivitamin with minerals  1 tablet Oral Q supper  . pantoprazole  40 mg Oral Q breakfast  . potassium chloride  40 mEq Oral BID  . rifaximin  200 mg Oral BID  . sodium chloride  3 mL Intravenous Q12H  . vancomycin  250 mg Oral 4 times per day   acetaminophen **OR** acetaminophen, loperamide, LORazepam  Assessment/ Plan:  79 y.o. male with ESRD,  h/o alcohol abuse, pancytopenia, hepatic cirrhosis    HD: MWF [facility]/Dr Kolluru  1. ESRD on HD MWF.  - Pt had HD yesterday, no acute indication for HD today, will plan for HD again tomorrow.  2. Anemia of CKD: hgb up to 9.7, continue epogen 10000 units IV with  HD.    3. SHPTH of renal origin:  -Phos less than 1, repeat phos today, was given NaPhos yesterday.  4.Fever with lactic acidosis/hypotension- Likely sepsis from UTI - remains on pressors this AM, overall status worsening, may need to undergo  intubation if status worsens today, overall prognosis guarded. C. Diff toxin also positive.  On vancomycin oral.     LOS: 3 Mike Booker 12/13/20169:16 AM

## 2015-11-28 NOTE — Progress Notes (Signed)
PHARMACY - CRITICAL CARE PROGRESS NOTE  Pharmacy Consult for electrolytes Indication: electrolyte replacement   No Known Allergies  Patient Measurements: Height: 5\' 8"  (172.7 cm) Weight: 171 lb 4.8 oz (77.7 kg) IBW/kg (Calculated) : 68.4 Adjusted Body Weight:   Vital Signs: Temp: 99 F (37.2 C) (12/13 1600) Temp Source: Axillary (12/13 1600) BP: 90/57 mmHg (12/13 1600) Pulse Rate: 79 (12/13 1600) Intake/Output from previous day: 12/12 0701 - 12/13 0700 In: 1162.4 [P.O.:240; I.V.:922.4] Out: -200  Intake/Output from this shift: Total I/O In: 428 [I.V.:28; NG/GT:100; IV Piggyback:300] Out: -  Vent settings for last 24 hours:    Labs:  Recent Labs  11/27/15 0310 11/28/15 0515 11/28/15 1556  WBC 7.9 5.3 2.2*  HGB 8.9* 9.7* 7.1*  HCT 26.4* 28.8* 21.8*  PLT 87* 76* 49*  CREATININE 2.41* 1.74* 1.87*  MG  --   --  1.6*  PHOS <1.0* 2.1* 2.2*  ALBUMIN 1.6*  --  1.5*   Estimated Creatinine Clearance: 32 mL/min (by C-G formula based on Cr of 1.87).   Recent Labs  11/25/15 2118  GLUCAP 165*    Microbiology: Recent Results (from the past 720 hour(s))  Urine culture     Status: None   Collection Time: 11/25/15  6:19 PM  Result Value Ref Range Status   Specimen Description URINE, RANDOM  Final   Special Requests NONE  Final   Culture >=100,000 COLONIES/mL ESCHERICHIA COLI  Final   Report Status 11/28/2015 FINAL  Final   Organism ID, Bacteria ESCHERICHIA COLI  Final      Susceptibility   Escherichia coli - MIC*    AMPICILLIN <=2 SENSITIVE Sensitive     CEFTAZIDIME <=1 SENSITIVE Sensitive     CEFAZOLIN <=4 SENSITIVE Sensitive     CEFTRIAXONE <=1 SENSITIVE Sensitive     CIPROFLOXACIN <=0.25 SENSITIVE Sensitive     GENTAMICIN <=1 SENSITIVE Sensitive     IMIPENEM <=0.25 SENSITIVE Sensitive     TRIMETH/SULFA <=20 SENSITIVE Sensitive     NITROFURANTOIN Value in next row Sensitive      SENSITIVE<=16    PIP/TAZO Value in next row Sensitive      SENSITIVE<=4    *  >=100,000 COLONIES/mL ESCHERICHIA COLI  Blood Culture (routine x 2)     Status: None (Preliminary result)   Collection Time: 11/25/15  6:22 PM  Result Value Ref Range Status   Specimen Description BLOOD RIGHT ARM  Final   Special Requests BOTTLES DRAWN AEROBIC AND ANAEROBIC  1CC  Final   Culture NO GROWTH 3 DAYS  Final   Report Status PENDING  Incomplete  Blood Culture (routine x 2)     Status: None (Preliminary result)   Collection Time: 11/25/15  6:23 PM  Result Value Ref Range Status   Specimen Description BLOOD RIGHT AC  Final   Special Requests BOTTLES DRAWN AEROBIC AND ANAEROBIC  1CC  Final   Culture NO GROWTH 3 DAYS  Final   Report Status PENDING  Incomplete  MRSA PCR Screening     Status: None   Collection Time: 11/25/15  9:10 PM  Result Value Ref Range Status   MRSA by PCR NEGATIVE NEGATIVE Final    Comment:        The GeneXpert MRSA Assay (FDA approved for NASAL specimens only), is one component of a comprehensive MRSA colonization surveillance program. It is not intended to diagnose MRSA infection nor to guide or monitor treatment for MRSA infections.   C difficile quick scan w PCR reflex  Status: Abnormal   Collection Time: 11/27/15  2:13 AM  Result Value Ref Range Status   C Diff antigen POSITIVE (A) NEGATIVE Final   C Diff toxin NEGATIVE NEGATIVE Final   C Diff interpretation   Final    Positive for toxigenic C. difficile, active toxin production not detected. Patient has toxigenic C. difficile organisms present in the bowel, but toxin was not detected. The patient may be a carrier or the level of toxin in the sample was below the limit  of detection. This information should be used in conjunction with the patient's clinical history when deciding on possible therapy.   Clostridium Difficile by PCR     Status: Abnormal   Collection Time: 11/27/15  2:13 AM  Result Value Ref Range Status   Toxigenic C Difficile by pcr POSITIVE (A) NEGATIVE Final    Comment:  CRITICAL RESULT CALLED TO, READ BACK BY AND VERIFIED WITH:  FELICIA PEREUDAMME AT 3086 11/27/15 WDM     Medications:  Scheduled:  . ALPRAZolam  1 mg Oral TID  . antiseptic oral rinse  7 mL Mouth Rinse BID  . cefTRIAXone (ROCEPHIN)  IV  1 g Intravenous Q24H  . epoetin (EPOGEN/PROCRIT) injection  10,000 Units Intravenous Q M,W,F-HD  . feeding supplement (PRO-STAT SUGAR FREE 64)  30 mL Per Tube Daily  . free water  25 mL Per Tube 6 times per day  . heparin  5,000 Units Subcutaneous 3 times per day  . hydrocortisone sod succinate (SOLU-CORTEF) inj  50 mg Intravenous Q6H  . magnesium sulfate 1 - 4 g bolus IVPB  2 g Intravenous Once  . mirtazapine  15 mg Oral QHS  . multivitamin with minerals  1 tablet Oral Q supper  . pantoprazole  40 mg Oral Q breakfast  . rifaximin  400 mg Oral 3 times per day  . sodium chloride  3 mL Intravenous Q12H  . sodium phosphate  Dextrose 5% IVPB  10 mmol Intravenous Once  . spiritus frumenti  1 each Oral BID  . vancomycin  250 mg Oral 4 times per day    Assessment: 12/13 @ 16:00:    Ca = 7.4,  Corrected Ca = 9.4                              Mg = 1.6                              Phos = 2.2  Goal of Therapy:  Normalization of electrolytes  Plan:  Will order Magnesium sulfate 2 gm IV X 1 . Will order NaPhos 10 mmol IV X 1.  Will recheck electrolytes on 12/14 with AM labs.   Mike Booker D 11/28/2015,6:32 PM

## 2015-11-28 NOTE — Consult Note (Signed)
[facility] [place] [facility] Consultation     ASSESSMENT/PLAN  79 yo white male with ESRD on HD with acute septic shock-from UTi,c diff colitis, less likely pneumonia, now with acute encephalopathy from DT's-high risk for aspiration pneumonia and progressive resp failure  RENAL A:  End-stage renal disease. -UTI with sepsis. P:   Continue antibiotics as prescribed   PULMONARY  A: Possible pneumonia P:   -Given lack of respiratory failure. At this time. Pneumonia seems doubtful.  CARDIOVASCULAR  A: Hypotension due to septic shock-slowly improving P:  -will re-evaluate for central line placement -continue stress dose streroids -follow up cultures -wean off levophed and then vasopressin   GASTROINTESTINAL A:  Cirrhosis P:   Stable  HEMATOLOGIC A:  Anemia P:  We'll continue to monitor.  INFECTIOUS A:  Sepsis P:   -Suspect due to UTI, continue antibiotics.  BCx2 12/10: Negative thus far. UC 12/10: Pending. Urine culture 8/24: Escherichia coli: Pansensitive.  Zosyn: 12/10>> Vancomycin: 12/10>>  NEURO -patient now with DT's -CIWA protocol -high risk for intubation   INDWELLING DEVICES:: Permacath.    ---------------------------------------  ---------------------------------------   Name: Mike Booker MRN: [id number] DOB: 07/02/1936    ADMISSION DATE:  11/25/2015 CONSULTATION DATE:  12/11  REFERRING MD :  Dr. Winona LegatoVaickute  CHIEF COMPLAINT:  Septic shock,confusion   HISTORY OF PRESENT ILLNESS:   Somnelent, responds to painful stimuli, patient with Dt's High risk for intubation    Prior to Admission medications   Medication Sig Start Date End Date Taking? Authorizing Provider  furosemide (LASIX) 20 MG tablet Take 20 mg by mouth daily.    Yes Historical Provider, MD  loperamide (IMODIUM) 2 MG capsule Take 2 mg by mouth as needed for diarrhea or loose stools.    Yes Historical Provider, MD  mirtazapine (REMERON) 15 MG tablet Take 15 mg  by mouth at bedtime. 11/13/15  Yes Historical Provider, MD  Multiple Vitamin (MULTIVITAMIN WITH MINERALS) TABS tablet Take 1 tablet by mouth daily.   Yes Historical Provider, MD  omeprazole (PRILOSEC) 40 MG capsule Take 40 mg by mouth daily.   Yes Historical Provider, MD  HYDROcodone-acetaminophen (NORCO/VICODIN) 5-325 MG tablet Take 1-2 tablets by mouth every 6 (six) hours as needed for moderate pain. 10/19/15   Annice NeedyJason S Dew, MD   No Known Allergies  Review of Systems  Unable to perform ROS: critical illness  All other systems reviewed and are negative.   VITAL SIGNS: Temp:  [97.6 F (36.4 C)-99.9 F (37.7 C)] 98 F (36.7 C) (12/13 0800) Pulse Rate:  [65-130] 65 (12/13 0600) Resp:  [18-33] 27 (12/13 0600) BP: (68-141)/(29-123) 100/65 mmHg (12/13 0600) SpO2:  [92 %-100 %] 100 % (12/13 0600) Weight:  [171 lb 4.8 oz (77.7 kg)] 171 lb 4.8 oz (77.7 kg) (12/12 1025) HEMODYNAMICS:   VENTILATOR SETTINGS:   INTAKE / OUTPUT:  Intake/Output Summary (Last 24 hours) at 11/28/15 0910 Last data filed at 11/28/15 0600  Gross per 24 hour  Intake 844.24 ml  Output   -200 ml  Net 1044.24 ml    Physical Examination:   VS: BP 100/65 mmHg  Pulse 65  Temp(Src) 98 F (36.7 C) (Axillary)  Resp 27  Ht 5\' 8"  (1.727 m)  Wt 171 lb 4.8 oz (77.7 kg)  BMI 26.05 kg/m2  SpO2 100%  General Appearance: minimal distress Neuro:without focal findings, mental status, confused/obtunded,  HEENT: PERRLA, EOM intact,  Pulmonary: normal breath sounds., diaphragmatic excursion normal.No wheezing, No rales;     CardiovascularNormal S1,S2.  No m/r/g.    Abdomen: Benign, Soft, non-tender, No masses, hepatosplenomegaly, No lymphadenopathy  LABS: Reviewed   LABORATORY PANEL:   CBC  Recent Labs Lab 11/28/15 0515  WBC 5.3  HGB 9.7*  HCT 28.8*  PLT 76*    Chemistries   Recent Labs Lab 11/25/15 1819  11/27/15 0310 11/28/15 0515  NA 132*  < > 134* 135  K 2.4*  < > 4.1 5.4*  CL 100*  < > 108  102  CO2 24  < > 23 25  GLUCOSE 158*  < > 128* 298*  BUN 10  < > 16 9  CREATININE 2.06*  < > 2.41* 1.74*  CALCIUM 8.2*  < > 7.8* 8.0*  PHOS  --   --  <1.0*  --   AST 251*  --   --   --   ALT 65*  --   --   --   ALKPHOS 217*  --   --   --   BILITOT 4.1*  --   --   --   < > = values in this interval not displayed.   Recent Labs Lab 11/25/15 2118  GLUCAP 165*    Recent Labs Lab 11/28/15 0743  PHART 7.48*  PCO2ART 36  PO2ART 65*    Recent Labs Lab 11/25/15 1819 11/27/15 0310  AST 251*  --   ALT 65*  --   ALKPHOS 217*  --   BILITOT 4.1*  --   ALBUMIN 2.0* 1.6*    Cardiac Enzymes  Recent Labs Lab 11/26/15 0914  TROPONINI 0.04*    RADIOLOGY:  No results found.    The Patient requires high complexity decision making for assessment and support, frequent evaluation and titration of therapies, application of advanced monitoring technologies and extensive interpretation of multiple databases. Critical Care Time devoted to patient care services described in this note is 35 minutes.   Overall, patient is critically ill, prognosis is guarded.     Lucie Leather, M.D.  [facility]  Medical Director [facility] Medical Director [facility] Cardio-Pulmonary Department

## 2015-11-28 NOTE — Progress Notes (Signed)
Initial Nutrition Assessment     INTERVENTION:   Coordination of Care: dobhoff/NG placed today for administration of meds as pt unable to take po due to poor mentation; received verbal order from MD Kasa to also start TF via tube EN: recommend starting Nepro at rate of 20 ml/hr, goal of 45 ml/hr with Prostat BID (102 g of protein, 2044 kcals, 788 mL of free water   NUTRITION DIAGNOSIS:   Inadequate oral intake related to chronic illness, acute illness, lethargy/confusion as evidenced by meal completion < 50% (inability to take po).  GOAL:   Patient will meet greater than or equal to 90% of their needs  MONITOR:    (EN, Digestive System, Energy Intake, Anthropometrics, Electrolyte/Renal Profile, Glucose Profile)  REASON FOR ASSESSMENT:   Consult Enteral/tube feeding initiation and management  ASSESSMENT:    Pt admitted with sespis due to LLL pneumonia, AMS with EtOH withdrawl, UTI; pt with ESRD, receiving HD. Pt also with Cdiff diarrhea  Past Medical History  Diagnosis Date  . Alcoholic cirrhosis (HCC)   . Thrombocytopenia (HCC)   . High serum ferritin   . Hepatorenal syndrome (HCC)     Secondary  . IDA (iron deficiency anemia) 05/24/2015  . Hypertension   . Diabetes mellitus without complication (HCC)     no longer diabetic since 3 yr ago     Diet Order:  DIET SOFT Room service appropriate?: Yes; Fluid consistency:: Thin   Energy Intake: pt ate 40% of breakfast yesterday, unable to take po today due to mental status  Food and nutrition related history: unable to assess   Glucose Profile:   Recent Labs  11/25/15 2118  GLUCAP 165*   Electrolyte and Renal Profile:  Recent Labs Lab 11/26/15 0200 11/27/15 0310 11/28/15 0515  BUN 12 16 9   CREATININE 2.26* 2.41* 1.74*  NA 133* 134* 135  K 3.1* 4.1 5.4*  PHOS  --  <1.0* 2.1*   Protein Profile:   Recent Labs Lab 11/25/15 1819 11/27/15 0310  ALBUMIN 2.0* 1.6*   Meds: MVI, remeron, levophed,  protonix, whiskey 1 oz BID daily  Height:   Ht Readings from Last 1 Encounters:  11/25/15 5\' 8"  (1.727 m)    Weight:   Wt Readings from Last 1 Encounters:  11/27/15 171 lb 4.8 oz (77.7 kg)    Filed Weights   11/25/15 1816 11/27/15 1025  Weight: 163 lb 9.3 oz (74.2 kg) 171 lb 4.8 oz (77.7 kg)    BMI:  Body mass index is 26.05 kg/(m^2).  Estimated Nutritional Needs:   Kcal:  [id number] kcals (BEE 1472, 1.2 AF, 1.1-1.3 IF)   Protein:  93-109 g (1.2-1.4 g/kg)   Fluid:  1000 mL plus UOP   HIGH Care Level  Romelle Starcherate Zenita Kister MS, RD, LDN [phone] Pager

## 2015-11-29 ENCOUNTER — Inpatient Hospital Stay: Payer: Medicare Other

## 2015-11-29 DIAGNOSIS — K7030 Alcoholic cirrhosis of liver without ascites: Secondary | ICD-10-CM

## 2015-11-29 DIAGNOSIS — N390 Urinary tract infection, site not specified: Secondary | ICD-10-CM

## 2015-11-29 DIAGNOSIS — R400 Somnolence: Secondary | ICD-10-CM

## 2015-11-29 DIAGNOSIS — D509 Iron deficiency anemia, unspecified: Secondary | ICD-10-CM

## 2015-11-29 DIAGNOSIS — A414 Sepsis due to anaerobes: Secondary | ICD-10-CM

## 2015-11-29 DIAGNOSIS — F10239 Alcohol dependence with withdrawal, unspecified: Secondary | ICD-10-CM

## 2015-11-29 DIAGNOSIS — R54 Age-related physical debility: Secondary | ICD-10-CM

## 2015-11-29 DIAGNOSIS — K767 Hepatorenal syndrome: Secondary | ICD-10-CM

## 2015-11-29 DIAGNOSIS — I120 Hypertensive chronic kidney disease with stage 5 chronic kidney disease or end stage renal disease: Secondary | ICD-10-CM

## 2015-11-29 DIAGNOSIS — Z79899 Other long term (current) drug therapy: Secondary | ICD-10-CM

## 2015-11-29 DIAGNOSIS — D696 Thrombocytopenia, unspecified: Secondary | ICD-10-CM

## 2015-11-29 LAB — CBC
HEMATOCRIT: 24.2 % — AB (ref 40.0–52.0)
HEMOGLOBIN: 7.9 g/dL — AB (ref 13.0–18.0)
MCH: 35.9 pg — ABNORMAL HIGH (ref 26.0–34.0)
MCHC: 32.9 g/dL (ref 32.0–36.0)
MCV: 109.1 fL — AB (ref 80.0–100.0)
Platelets: 70 10*3/uL — ABNORMAL LOW (ref 150–440)
RBC: 2.22 MIL/uL — ABNORMAL LOW (ref 4.40–5.90)
RDW: 17.3 % — AB (ref 11.5–14.5)
WBC: 4.8 10*3/uL (ref 3.8–10.6)

## 2015-11-29 LAB — BASIC METABOLIC PANEL
Anion gap: 5 (ref 5–15)
BUN: 16 mg/dL (ref 6–20)
CHLORIDE: 102 mmol/L (ref 101–111)
CO2: 29 mmol/L (ref 22–32)
Calcium: 7.7 mg/dL — ABNORMAL LOW (ref 8.9–10.3)
Creatinine, Ser: 2.16 mg/dL — ABNORMAL HIGH (ref 0.61–1.24)
GFR calc Af Amer: 32 mL/min — ABNORMAL LOW (ref >60)
GFR, EST NON AFRICAN AMERICAN: 28 mL/min — AB (ref >60)
GLUCOSE: 288 mg/dL — AB (ref 65–99)
POTASSIUM: 4.6 mmol/L (ref 3.5–5.1)
SODIUM: 136 mmol/L (ref 135–145)

## 2015-11-29 LAB — BLOOD GAS, VENOUS
Acid-Base Excess: 3.4 mmol/L — ABNORMAL HIGH (ref 0.0–3.0)
BICARBONATE: 26.8 meq/L (ref 21.0–28.0)
PCO2 VEN: 36 mmHg — AB (ref 44.0–60.0)
Patient temperature: 37
pH, Ven: 7.48 — ABNORMAL HIGH (ref 7.320–7.430)

## 2015-11-29 LAB — PHOSPHORUS: Phosphorus: 2.9 mg/dL (ref 2.5–4.6)

## 2015-11-29 LAB — MAGNESIUM: MAGNESIUM: 2.2 mg/dL (ref 1.7–2.4)

## 2015-11-29 MED ORDER — MORPHINE SULFATE (CONCENTRATE) 10 MG/0.5ML PO SOLN: 5.0000 mg | Freq: Four times a day (QID) | ORAL | Status: DC

## 2015-11-29 MED ORDER — MORPHINE SULFATE (CONCENTRATE) 10 MG/0.5ML PO SOLN: 5.0000 mg | Freq: Four times a day (QID) | ORAL | Status: AC

## 2015-11-29 MED ORDER — ACETAMINOPHEN 650 MG RE SUPP: 650.0000 mg | Freq: Four times a day (QID) | RECTAL | Status: AC | PRN

## 2015-11-29 MED ORDER — NEPRO/CARBSTEADY PO LIQD
1000.0000 mL | ORAL | Status: DC
  Administered 2015-11-29: 1000 mL via ORAL

## 2015-11-29 MED ORDER — MORPHINE SULFATE (CONCENTRATE) 10 MG/0.5ML PO SOLN: 5.0000 mg | ORAL | Status: AC | PRN

## 2015-11-29 MED ORDER — ALBUMIN HUMAN 25 % IV SOLN
25.0000 g | Freq: Once | INTRAVENOUS | Status: DC
  Filled 2015-11-29: qty 100

## 2015-11-29 MED ORDER — GLYCOPYRROLATE 1 MG PO TABS: 1.0000 mg | ORAL_TABLET | Freq: Four times a day (QID) | ORAL | Status: AC | PRN

## 2015-11-29 MED ORDER — PROCHLORPERAZINE 25 MG RE SUPP: 25.0000 mg | Freq: Two times a day (BID) | RECTAL | Status: AC | PRN

## 2015-11-29 MED ORDER — MORPHINE SULFATE (CONCENTRATE) 10 MG/0.5ML PO SOLN
5.0000 mg | ORAL | Status: DC | PRN
  Filled 2015-11-29: qty 1

## 2015-11-29 MED ORDER — LORAZEPAM 2 MG/ML IJ SOLN: 2.0000 mg | INTRAMUSCULAR | Status: AC | PRN

## 2015-11-29 MED ORDER — LOPERAMIDE HCL 2 MG PO CAPS: 2.0000 mg | ORAL_CAPSULE | Freq: Three times a day (TID) | ORAL | Status: AC | PRN

## 2015-11-29 NOTE — Progress Notes (Signed)
Inpatient Diabetes Program Recommendations  AACE/ADA: New Consensus Statement on Inpatient Glycemic Control (2015)  Target Ranges:  Prepandial:   less than 140 mg/dL      Peak postprandial:   less than 180 mg/dL (1-2 hours)      Critically ill patients:  140 - 180 mg/dL   Results for Mike Booker, Mike Booker (MRN [id number]) as of 11/29/2015 09:05  Ref. Range 11/26/2015 02:00 11/27/2015 03:10 11/28/2015 05:15 11/28/2015 15:56 11/29/2015 03:14  Glucose Latest Ref Range: 65-99 mg/dL 829311 (H) 562128 (H) 130298 (H) 252 (H) 288 (H)   Review of Glycemic Control  Diabetes history: No Outpatient Diabetes medications: NA Current orders for Inpatient glycemic control: None  Inpatient Diabetes Program Recommendations: Correction (SSI): Patient is ordered Solucortef 50 mg Q6H which is likely cause of hyperglycemia. Lab glucose 288 mg/dl this morning at 8:653:14. While ordered steroids, please consider ordering CBGs with Novolog sensitive correction scale.  Thanks, Orlando PennerMarie Myking Sar, RN, MSN, CDE Diabetes Coordinator Inpatient Diabetes Program [phone] (Team Pager from 8am to 5pm) [phone] ([facility] office) [phone] [facility] office) [phone] [facility] office)'

## 2015-11-29 NOTE — Progress Notes (Signed)
Palliative Care Update  Full note to follow.  Pt seen and examined and I have spoken with family at length.   After reviewing all options for aggressive care, comfort care in the home setting with hospice and also the option of Hospice Home (which pt is appropriate for), family has chosen Hospice Home.    I have updated Care Mgr, Dr Belia HemanKasa (we have talked off and on during the course of the day), nursing, and Hospice Liaison.  DNR in the paper chart.    I have informed Dr. Elisabeth PigeonVachhani and will do DC meds.  Suan HalterMargaret F Jonh Mcqueary, MD

## 2015-11-29 NOTE — Progress Notes (Signed)
PHARMACY - CRITICAL CARE PROGRESS NOTE  Pharmacy Consult for electrolytes Indication: electrolyte replacement   No Known Allergies  Patient Measurements: Height:  (172.7 cm) Weight: 171 lb 4.8 oz (77.7 kg) IBW/kg (Calculated) : 68.4 Adjusted Body Weight:   Vital Signs: BP: 118/78 mmHg (12/14 0600) Pulse Rate: 83 (12/14 0600) Intake/Output from previous day: 12/13 0701 - 12/14 0700 In: 956.3 [I.V.:152.3; NG/GT:420; IV Piggyback:384] Out: -  Intake/Output from this shift: Total I/O In: 322 [NG/GT:280; IV Piggyback:42] Out: -  Vent settings for last 24 hours:    Labs:  Recent Labs  11/27/15 0310 11/28/15 0515 11/28/15 1556 11/29/15 0314  WBC 7.9 5.3 2.2* 4.8  HGB 8.9* 9.7* 7.1* 7.9*  HCT 26.4* 28.8* 21.8* 24.2*  PLT 87* 76* 49* 70*  CREATININE 2.41* 1.74* 1.87* 2.16*  MG  --   --  1.6*  --   PHOS <1.0* 2.1* 2.1*  2.2* 2.9  ALBUMIN 1.6*  --  1.5*  --    Estimated Creatinine Clearance: 27.7 mL/min (by C-G formula based on Cr of 2.16).  No results for input(s): GLUCAP in the last 72 hours.  Microbiology: Recent Results (from the past 720 hour(s))  Urine culture     Status: None   Collection Time: 11/25/15  6:19 PM  Result Value Ref Range Status   Specimen Description URINE, RANDOM  Final   Special Requests NONE  Final   Culture >=100,000 COLONIES/mL ESCHERICHIA COLI  Final   Report Status 11/28/2015 FINAL  Final   Organism ID, Bacteria ESCHERICHIA COLI  Final      Susceptibility   Escherichia coli - MIC*    AMPICILLIN <=2 SENSITIVE Sensitive     CEFTAZIDIME <=1 SENSITIVE Sensitive     CEFAZOLIN <=4 SENSITIVE Sensitive     CEFTRIAXONE <=1 SENSITIVE Sensitive     CIPROFLOXACIN <=0.25 SENSITIVE Sensitive     GENTAMICIN <=1 SENSITIVE Sensitive     IMIPENEM <=0.25 SENSITIVE Sensitive     TRIMETH/SULFA <=20 SENSITIVE Sensitive     NITROFURANTOIN Value in next row Sensitive      SENSITIVE<=16    PIP/TAZO Value in next row Sensitive      SENSITIVE<=4   * >=100,000 COLONIES/mL ESCHERICHIA COLI  Blood Culture (routine x 2)     Status: None (Preliminary result)   Collection Time: 11/25/15  6:22 PM  Result Value Ref Range Status   Specimen Description BLOOD RIGHT ARM  Final   Special Requests BOTTLES DRAWN AEROBIC AND ANAEROBIC  1CC  Final   Culture NO GROWTH 3 DAYS  Final   Report Status PENDING  Incomplete  Blood Culture (routine x 2)     Status: None (Preliminary result)   Collection Time: 11/25/15  6:23 PM  Result Value Ref Range Status   Specimen Description BLOOD RIGHT AC  Final   Special Requests BOTTLES DRAWN AEROBIC AND ANAEROBIC  1CC  Final   Culture NO GROWTH 3 DAYS  Final   Report Status PENDING  Incomplete  MRSA PCR Screening     Status: None   Collection Time: 11/25/15  9:10 PM  Result Value Ref Range Status   MRSA by PCR NEGATIVE NEGATIVE Final    Comment:        The GeneXpert MRSA Assay (FDA approved for NASAL specimens only), is one component of a comprehensive MRSA colonization surveillance program. It is not intended to diagnose MRSA infection nor to guide or monitor treatment for MRSA infections.   C difficile quick scan w  PCR reflex     Status: Abnormal   Collection Time: 11/27/15  2:13 AM  Result Value Ref Range Status   C Diff antigen POSITIVE (A) NEGATIVE Final   C Diff toxin NEGATIVE NEGATIVE Final   C Diff interpretation   Final    Positive for toxigenic C. difficile, active toxin production not detected. Patient has toxigenic C. difficile organisms present in the bowel, but toxin was not detected. The patient may be a carrier or the level of toxin in the sample was below the limit  of detection. This information should be used in conjunction with the patient's clinical history when deciding on possible therapy.   Clostridium Difficile by PCR     Status: Abnormal   Collection Time: 11/27/15  2:13 AM  Result Value Ref Range Status   Toxigenic C Difficile by pcr POSITIVE (A) NEGATIVE Final    Comment:  CRITICAL RESULT CALLED TO, READ BACK BY AND VERIFIED WITH:  FELICIA PEREUDAMME AT [id number] 11/27/15 WDM     Medications:  Scheduled:  . ALPRAZolam  1 mg Oral TID  . antiseptic oral rinse  7 mL Mouth Rinse BID  . cefTRIAXone (ROCEPHIN)  IV  1 g Intravenous Q24H  . epoetin (EPOGEN/PROCRIT) injection  10,000 Units Intravenous Q M,W,F-HD  . feeding supplement (PRO-STAT SUGAR FREE 64)  30 mL Per Tube Daily  . free water  25 mL Per Tube 6 times per day  . heparin  5,000 Units Subcutaneous 3 times per day  . hydrocortisone sod succinate (SOLU-CORTEF) inj  50 mg Intravenous Q6H  . mirtazapine  15 mg Oral QHS  . multivitamin with minerals  1 tablet Oral Q supper  . pantoprazole  40 mg Oral Q breakfast  . rifaximin  400 mg Oral 3 times per day  . sodium chloride  3 mL Intravenous Q12H  . sodium phosphate  Dextrose 5% IVPB  10 mmol Intravenous Once  . spiritus frumenti  1 each Oral BID  . vancomycin  250 mg Oral 4 times per day    Assessment: 12/13 @ 16:00:    Ca = 7.4,  Corrected Ca = 9.4                              Mg = 1.6                              Phos = 2.2  Goal of Therapy:  Normalization of electrolytes  Plan:  Will order Magnesium sulfate 2 gm IV X 1 . Will order NaPhos 10 mmol IV X 1.  Will recheck electrolytes on 12/14 with AM labs.   12/14 AM electrolytes WNL. Recheck BMP in AM.  Mike Booker S 11/29/2015,6:23 AM

## 2015-11-29 NOTE — Progress Notes (Signed)
Subjective:  Pt seen at bedside.   Still very lethargic, but is arousable. Being given ETOH to avoid DTs.   Objective:  Vital signs in last 24 hours:  Temp:  [98.8 F (37.1 C)-99 F (37.2 C)] 99 F (37.2 C) (12/13 1600) Pulse Rate:  [69-94] 83 (12/14 0600) Resp:  [17-29] 29 (12/14 0600) BP: (65-174)/(34-156) 118/78 mmHg (12/14 0600) SpO2:  [94 %-100 %] 97 % (12/14 0600)  Weight change:  Filed Weights   11/25/15 1816 11/27/15 1025  Weight: 74.2 kg (163 lb 9.3 oz) 77.7 kg (171 lb 4.8 oz)    Intake/Output:    Intake/Output Summary (Last 24 hours) at 11/29/15 0949 Last data filed at 11/29/15 0931  Gross per 24 hour  Intake 956.28 ml  Output      0 ml  Net 956.28 ml     Physical Exam: General: NAD, laying in bed  HEENT Moist oral mucus membranes hearing intact  Neck supple  Pulm/lungs Clear b/l, normal resp effort  CVS/Heart S1S2 no rubs  Abdomen:  Soft, NTND, BS present  Extremities: trace peripheral edema  Neurologic: Lethargic, difficult to arouse  Skin: Bruises b/l UE's  Access: Rt IJ PC, Left arm AVF developing       Basic Metabolic Panel:   Recent Labs Lab 11/26/15 0200 11/27/15 0310 11/28/15 0515 11/28/15 1556 11/29/15 0314  NA 133* 134* 135 135 136  K 3.1* 4.1 5.4* 4.7 4.6  CL 106 108 102 98* 102  CO2 GLUCOSE 311* 128* 298* 252* 288*  BUN CREATININE 2.26* 2.41* 1.74* 1.87* 2.16*  CALCIUM 7.3* 7.8* 8.0* 7.4* 7.7*  MG  --   --   --  1.6*  --   PHOS  --  <1.0* 2.1* 2.1*  2.2* 2.9     CBC:  Recent Labs Lab 11/25/15 1819 11/26/15 0200 11/27/15 0310 11/28/15 0515 11/28/15 1556 11/29/15 0314  WBC 3.9 12.3* 7.9 5.3 2.2* 4.8  NEUTROABS 3.3  --   --   --   --   --   HGB 9.5* 8.6* 8.9* 9.7* 7.1* 7.9*  HCT 28.0* 26.0* 26.4* 28.8* 21.8* 24.2*  MCV 110.1* 111.4* 109.8* 110.5* 109.2* 109.1*  PLT 89* 100* 87* 76* 49* 70*      Microbiology:  Recent Results (from the past 720 hour(s))  Urine culture      Status: None   Collection Time: 11/25/15  6:19 PM  Result Value Ref Range Status   Specimen Description URINE, RANDOM  Final   Special Requests NONE  Final   Culture >=100,000 COLONIES/mL ESCHERICHIA COLI  Final   Report Status 11/28/2015 FINAL  Final   Organism ID, Bacteria ESCHERICHIA COLI  Final      Susceptibility   Escherichia coli - MIC*    AMPICILLIN <=2 SENSITIVE Sensitive     CEFTAZIDIME <=1 SENSITIVE Sensitive     CEFAZOLIN <=4 SENSITIVE Sensitive     CEFTRIAXONE <=1 SENSITIVE Sensitive     CIPROFLOXACIN <=0.25 SENSITIVE Sensitive     GENTAMICIN <=1 SENSITIVE Sensitive     IMIPENEM <=0.25 SENSITIVE Sensitive     TRIMETH/SULFA <=20 SENSITIVE Sensitive     NITROFURANTOIN Value in next row Sensitive      SENSITIVE<=16    PIP/TAZO Value in next row Sensitive      SENSITIVE<=4    * >=100,000 COLONIES/mL ESCHERICHIA COLI  Blood Culture (routine x 2)     Status: None (Preliminary  result)   Collection Time: 11/25/15  6:22 PM  Result Value Ref Range Status   Specimen Description BLOOD RIGHT ARM  Final   Special Requests BOTTLES DRAWN AEROBIC AND ANAEROBIC  1CC  Final   Culture NO GROWTH 3 DAYS  Final   Report Status PENDING  Incomplete  Blood Culture (routine x 2)     Status: None (Preliminary result)   Collection Time: 11/25/15  6:23 PM  Result Value Ref Range Status   Specimen Description BLOOD RIGHT AC  Final   Special Requests BOTTLES DRAWN AEROBIC AND ANAEROBIC  1CC  Final   Culture NO GROWTH 3 DAYS  Final   Report Status PENDING  Incomplete  MRSA PCR Screening     Status: None   Collection Time: 11/25/15  9:10 PM  Result Value Ref Range Status   MRSA by PCR NEGATIVE NEGATIVE Final    Comment:        The GeneXpert MRSA Assay (FDA approved for NASAL specimens only), is one component of a comprehensive MRSA colonization surveillance program. It is not intended to diagnose MRSA infection nor to guide or monitor treatment for MRSA infections.   C difficile quick  scan w PCR reflex     Status: Abnormal   Collection Time: 11/27/15  2:13 AM  Result Value Ref Range Status   C Diff antigen POSITIVE (A) NEGATIVE Final   C Diff toxin NEGATIVE NEGATIVE Final   C Diff interpretation   Final    Positive for toxigenic C. difficile, active toxin production not detected. Patient has toxigenic C. difficile organisms present in the bowel, but toxin was not detected. The patient may be a carrier or the level of toxin in the sample was below the limit  of detection. This information should be used in conjunction with the patient's clinical history when deciding on possible therapy.   Clostridium Difficile by PCR     Status: Abnormal   Collection Time: 11/27/15  2:13 AM  Result Value Ref Range Status   Toxigenic C Difficile by pcr POSITIVE (A) NEGATIVE Final    Comment: CRITICAL RESULT CALLED TO, READ BACK BY AND VERIFIED WITH:  FELICIA PEREUDAMME AT 0429 11/27/15 WDM     Coagulation Studies: No results for input(s): LABPROT, INR in the last 72 hours.  Urinalysis: No results for input(s): COLORURINE, LABSPEC, PHURINE, GLUCOSEU, HGBUR, BILIRUBINUR, KETONESUR, PROTEINUR, UROBILINOGEN, NITRITE, LEUKOCYTESUR in the last 72 hours.  Invalid input(s): APPERANCEUR    Imaging: Dg Chest Port 1 View  11/29/2015  CLINICAL DATA:  History of cough, pneumonia, previous tobacco use. ; history of cirrhosis, renal failure, and diabetes. EXAM: PORTABLE CHEST 1 VIEW COMPARISON:  Portable chest x-ray of November 25, 2015 FINDINGS: The lungs remain borderline hypoinflated. The interstitial markings remain increased. Subsegmental atelectasis at the left lung base is more conspicuous today. The cardiac silhouette is mildly enlarged. The central pulmonary vascularity is prominent. The dual-lumen dialysis type catheter tip projects over the midportion of the SVC. The feeding tube tip projects in the proximal gastric body. IMPRESSION: Mildly increased pulmonary vascular congestion  consistent with CHF. Interval increase in left lower lobe atelectasis or infiltrate. There is no significant pleural effusion. Electronically Signed   By: David  [place]aziland M.D.   On: 11/29/2015 08:37   Dg Abd Portable 1v  11/28/2015  CLINICAL DATA:  Encounter for NG tube placement. EXAM: PORTABLE ABDOMEN - 1 VIEW COMPARISON:  None. FINDINGS: The heart is mildly enlarged. Bilateral pleural effusions and basilar airspace disease  are present. The small bore feeding tube terminates within the antrum of the stomach. IMPRESSION: A small bore feeding tube terminates in the antrum of the stomach. Bilateral Pleural effusions and basilar airspace disease are noted. This likely reflects atelectasis. Electronically Signed   By: Marin Robertshristopher  Mattern M.D.   On: 11/28/2015 14:05     Medications:   . feeding supplement (NEPRO CARB STEADY) 1,000 mL (11/29/15 0929)  . norepinephrine (LEVOPHED) Adult infusion Stopped (11/28/15 1045)  . vasopressin (PITRESSIN) infusion - *FOR SHOCK* Stopped (11/29/15 [id number])   . albumin human  25 g Intravenous Once  . ALPRAZolam  1 mg Oral TID  . antiseptic oral rinse  7 mL Mouth Rinse BID  . cefTRIAXone (ROCEPHIN)  IV  1 g Intravenous Q24H  . epoetin (EPOGEN/PROCRIT) injection  10,000 Units Intravenous Q M,W,F-HD  . feeding supplement (PRO-STAT SUGAR FREE 64)  30 mL Per Tube Daily  . free water  25 mL Per Tube 6 times per day  . heparin  5,000 Units Subcutaneous 3 times per day  . hydrocortisone sod succinate (SOLU-CORTEF) inj  50 mg Intravenous Q6H  . mirtazapine  15 mg Oral QHS  . multivitamin with minerals  1 tablet Oral Q supper  . pantoprazole  40 mg Oral Q breakfast  . rifaximin  400 mg Oral 3 times per day  . sodium chloride  3 mL Intravenous Q12H  . sodium phosphate  Dextrose 5% IVPB  10 mmol Intravenous Once  . spiritus frumenti  1 each Oral BID  . vancomycin  250 mg Oral 4 times per day   acetaminophen **OR** acetaminophen, loperamide, LORazepam  Assessment/  Plan:  79 y.o. male with ESRD,  h/o alcohol abuse, pancytopenia, hepatic cirrhosis    HD: MWF [facility]/Dr Kolluru  1. ESRD on HD MWF.  - Pt seen at bedside, due for HD today, will provide albumin for BP support during HD.  Overall prognosis quite guarded.  2. Anemia of CKD: hgb down to 7.9, continue epogen 10000 units IV with HD.   May need transfusion.  3. SHPTH of renal origin:  -Phos up to 2.9 after administration of sodium phosphate.  4.Fever with lactic acidosis/hypotension- Likely sepsis from UTI - will use albumin for BP support, treatment of UTI/C. Diff colitis per Dr. Belia HemanKasa.       LOS: 4 Mike Booker 12/14/20169:49 AM

## 2015-11-29 NOTE — Progress Notes (Signed)
EMS notified for pick up staff RN's Dois DavenportSandra and HodgeBrooke notified. EMS reported several ahead of patient. Hospice home notified.  Dayna BarkerKaren Robertson RN, BSN, [facility] [place], [facility] [phone] c

## 2015-11-29 NOTE — Progress Notes (Signed)
Nutrition Follow-up  INTERVENTION:   EN: recommend continuing current TF regimen as tolerated by pt; monitor for signs and symptoms of GI intolerance such as N/V, increasing abdominal distention, etc. No residual checks. Pt already with liquid stool, +C.diff   NUTRITION DIAGNOSIS:   Inadequate oral intake related to chronic illness, acute illness, lethargy/confusion as evidenced by meal completion < 50% (inability to take po).  GOAL:   Patient will meet greater than or equal to 90% of their needs  MONITOR:    (EN, Digestive System, Energy Intake, Anthropometrics, Electrolyte/Renal Profile, Glucose Profile)  REASON FOR ASSESSMENT:   Consult Enteral/tube feeding initiation and management  ASSESSMENT:    Pt remains lethargic, off levophed and vasopressin this AM  Diet Order:  Diet NPO time specified   EN: tolerating Nepro at rate of 45 ml/hr via dobhoff tube, Prostat daily  Electrolyte and Renal Profile:  Recent Labs Lab 11/28/15 0515 11/28/15 1556 11/29/15 0314  BUN 9 11 16   CREATININE 1.74* 1.87* 2.16*  NA 135 135 136  K 5.4* 4.7 4.6  MG  --  1.6*  --   PHOS 2.1* 2.1*  2.2* 2.9   Glucose Profile: No results for input(s): GLUCAP in the last 72 hours.   Digestive System: no signs of TF intolerance, +several mucous stools, C.diff posotive  Meds: remeron, MVI, xifaxan, ethyl alcohol,   Height:   Ht Readings from Last 1 Encounters:  11/25/15 5\' 8"  (1.727 m)    Weight:   Wt Readings from Last 1 Encounters:  11/27/15 171 lb 4.8 oz (77.7 kg)   Filed Weights   11/25/15 1816 11/27/15 1025  Weight: 163 lb 9.3 oz (74.2 kg) 171 lb 4.8 oz (77.7 kg)    BMI:  Body mass index is 26.05 kg/(m^2).  Estimated Nutritional Needs:   Kcal:  [id number] kcals (BEE 1472, 1.2 AF, 1.1-1.3 IF)   Protein:  93-109 g (1.2-1.4 g/kg)   Fluid:  1000 mL plus UOP  HIGH Care Level  Romelle Starcherate Karolina Zamor MS, RD, LDN [phone] Pager

## 2015-11-29 NOTE — Progress Notes (Signed)
   11/29/15 1300  Clinical Encounter Type  Visited With Patient and family together  Visit Type Initial;Spiritual support  Consult/Referral To Chaplain  Spiritual Encounters  Spiritual Needs Emotional  Stress Factors  Patient Stress Factors None identified  Family Stress Factors None identified  Chaplain rounded in the unit and offered a compassionate presence to patient and family. Family notified me that their chaplain from the facility would be visiting. Informed them to please let me know if we could be of service. Chaplain Maryam Feely A. Melynda Krzywicki Ext. [phone]

## 2015-11-29 NOTE — Consult Note (Signed)
[facility] Consultation     ASSESSMENT/PLAN  79 yo white male with ESRD on HD with acute septic shock-from UTi,c diff colitis, less likely pneumonia, now with acute encephalopathy from DT's-high risk for aspiration pneumonia and progressive resp failure  RENAL A:  End-stage renal disease. -UTI with sepsis. P:   Continue antibiotics as prescribed   PULMONARY  A: Possible pneumonia P:   -Given lack of respiratory failure. At this time. Pneumonia seems doubtful.  CARDIOVASCULAR  A: Hypotension due to septic shock-slowly improving P:  -continue stress dose streroids -follow up cultures -wean off levophed and then vasopressin   GASTROINTESTINAL A:  Cirrhosis P:   Stable  HEMATOLOGIC A:  Anemia P:  We'll continue to monitor.  INFECTIOUS A:  Sepsis P:   -Suspect due to UTI, continue antibiotics.  BCx2 12/10: Negative thus far. UC 12/10: Pending. Urine culture 8/24: Escherichia coli: Pansensitive.  Zosyn: 12/10>> Vancomycin: 12/10>>  NEURO -patient now with DT's -CIWA protocol -high risk for aspiration pneumona   INDWELLING DEVICES:: Permacath.    ---------------------------------------  ---------------------------------------   Name: Mike Booker MRN: [id number] DOB: 02/09/1936    ADMISSION DATE:  11/25/2015 CONSULTATION DATE:  12/11  REFERRING MD :  Dr. Winona LegatoVaickute  CHIEF COMPLAINT:  Septic shock,confusion   HISTORY OF PRESENT ILLNESS:   Somnelent, responds to painful stimuli, patient with Dt's High risk for intubation, family at bedside,awaiting to talk with palliative care team-likley to be made comfort care measures    Prior to Admission medications   Medication Sig Start Date End Date Taking? Authorizing Provider  furosemide (LASIX) 20 MG tablet Take 20 mg by mouth daily.    Yes Historical Provider, MD  loperamide (IMODIUM) 2 MG capsule Take 2 mg by mouth as needed for diarrhea or loose stools.    Yes  Historical Provider, MD  mirtazapine (REMERON) 15 MG tablet Take 15 mg by mouth at bedtime. 11/13/15  Yes Historical Provider, MD  Multiple Vitamin (MULTIVITAMIN WITH MINERALS) TABS tablet Take 1 tablet by mouth daily.   Yes Historical Provider, MD  omeprazole (PRILOSEC) 40 MG capsule Take 40 mg by mouth daily.   Yes Historical Provider, MD  HYDROcodone-acetaminophen (NORCO/VICODIN) 5-325 MG tablet Take 1-2 tablets by mouth every 6 (six) hours as needed for moderate pain. 10/19/15   Annice NeedyJason S Dew, MD   No Known Allergies  Review of Systems  Unable to perform ROS: critical illness  All other systems reviewed and are negative.   VITAL SIGNS: Temp:  [98.6 F (37 C)-99 F (37.2 C)] 98.6 F (37 C) (12/14 0800) Pulse Rate:  [69-94] 83 (12/14 1000) Resp:  [22-34] 31 (12/14 1000) BP: (65-174)/(34-156) 101/58 mmHg (12/14 1000) SpO2:  [94 %-100 %] 98 % (12/14 0800) HEMODYNAMICS:   VENTILATOR SETTINGS:   INTAKE / OUTPUT:  Intake/Output Summary (Last 24 hours) at 11/29/15 1357 Last data filed at 11/29/15 1000  Gross per 24 hour  Intake 1030.4 ml  Output      0 ml  Net 1030.4 ml    Physical Examination:   VS: BP 101/58 mmHg  Pulse 83  Temp(Src) 98.6 F (37 C) (Axillary)  Resp 31  Ht 5\' 8"  (1.727 m)  Wt 171 lb 4.8 oz (77.7 kg)  BMI 26.05 kg/m2  SpO2 98%  General Appearance: minimal distress Neuro:without focal findings, mental status, confused/obtunded,  HEENT: PERRLA, EOM intact,  Pulmonary: normal breath sounds., diaphragmatic excursion normal.No wheezing, No rales;     CardiovascularNormal S1,S2.  No m/r/g.  Abdomen: Benign, Soft, non-tender, No masses, hepatosplenomegaly, No lymphadenopathy  LABS: Reviewed   LABORATORY PANEL:   CBC  Recent Labs Lab 11/29/15 0314  WBC 4.8  HGB 7.9*  HCT 24.2*  PLT 70*    Chemistries   Recent Labs Lab 11/25/15 1819  11/29/15 0314  NA 132*  < > 136  K 2.4*  < > 4.6  CL 100*  < > 102  CO2 24  < > 29  GLUCOSE 158*  < >  288*  BUN 10  < > 16  CREATININE 2.06*  < > 2.16*  CALCIUM 8.2*  < > 7.7*  MG  --   < > 2.2  PHOS  --   < > 2.9  AST 251*  --   --   ALT 65*  --   --   ALKPHOS 217*  --   --   BILITOT 4.1*  --   --   < > = values in this interval not displayed.   Recent Labs Lab 11/25/15 2118  GLUCAP 165*    Recent Labs Lab 11/28/15 0743  PHART 7.48*  PCO2ART 36  PO2ART 65*    Recent Labs Lab 11/25/15 1819 11/27/15 0310 11/28/15 1556  AST 251*  --   --   ALT 65*  --   --   ALKPHOS 217*  --   --   BILITOT 4.1*  --   --   ALBUMIN 2.0* 1.6* 1.5*    Cardiac Enzymes  Recent Labs Lab 11/26/15 0914  TROPONINI 0.04*    RADIOLOGY:  Dg Chest Port 1 View  11/29/2015  CLINICAL DATA:  History of cough, pneumonia, previous tobacco use. ; history of cirrhosis, renal failure, and diabetes. EXAM: PORTABLE CHEST 1 VIEW COMPARISON:  Portable chest x-ray of November 25, 2015 FINDINGS: The lungs remain borderline hypoinflated. The interstitial markings remain increased. Subsegmental atelectasis at the left lung base is more conspicuous today. The cardiac silhouette is mildly enlarged. The central pulmonary vascularity is prominent. The dual-lumen dialysis type catheter tip projects over the midportion of the SVC. The feeding tube tip projects in the proximal gastric body. IMPRESSION: Mildly increased pulmonary vascular congestion consistent with CHF. Interval increase in left lower lobe atelectasis or infiltrate. There is no significant pleural effusion. Electronically Signed   By: David  [place]aziland M.D.   On: 11/29/2015 08:37   Dg Abd Portable 1v  11/28/2015  CLINICAL DATA:  Encounter for NG tube placement. EXAM: PORTABLE ABDOMEN - 1 VIEW COMPARISON:  None. FINDINGS: The heart is mildly enlarged. Bilateral pleural effusions and basilar airspace disease are present. The small bore feeding tube terminates within the antrum of the stomach. IMPRESSION: A small bore feeding tube terminates in the antrum of  the stomach. Bilateral Pleural effusions and basilar airspace disease are noted. This likely reflects atelectasis. Electronically Signed   By: Marin Roberts M.D.   On: 11/28/2015 14:05      The Patient requires high complexity decision making for assessment and support, frequent evaluation and titration of therapies, application of advanced monitoring technologies and extensive interpretation of multiple databases. Critical Care Time devoted to patient care services described in this note is 35 minutes.   Overall, patient is critically ill, prognosis is guarded.     Lucie Leather, M.D.  [facility]  Medical Director [facility] Medical Director [facility] Cardio-Pulmonary Department

## 2015-11-29 NOTE — Progress Notes (Signed)
[facility] Daughter , Lynnae SandhoffSusanne, called and made aware of his transport. All personal belongings in room were transferred with the patient.

## 2015-11-29 NOTE — Discharge Summary (Signed)
[facility] [place] [facility]   PATIENT NAME: Mike Booker    MR#:  [id number]  DATE OF BIRTH:  04/22/1936  DATE OF ADMISSION:  11/25/2015 ADMITTING PHYSICIAN: Katharina Caperima Vaickute, MD  DATE OF DISCHARGE: 11/29/2015   PRIMARY CARE PHYSICIAN: Clydie BraunFITZGERALD, DAVID, MD    ADMISSION DIAGNOSIS:  Hypokalemia [E87.6] UTI (lower urinary tract infection) [N39.0] Community acquired pneumonia [J18.9] Elevated troponin I level [R79.89] Chronic kidney disease, unspecified stage [N18.9] Sepsis, due to unspecified organism (HCC) [A41.9]  DISCHARGE DIAGNOSIS:  Active Problems:   Sepsis (HCC)   c diff   Alcohol withdrawal  SECONDARY DIAGNOSIS:   Past Medical History  Diagnosis Date  . Alcoholic cirrhosis (HCC)   . Thrombocytopenia (HCC)   . High serum ferritin   . Hepatorenal syndrome (HCC)     Secondary  . IDA (iron deficiency anemia) 05/24/2015  . Hypertension   . Diabetes mellitus without complication (HCC)     no longer diabetic since 3 yr ago    HOSPITAL COURSE:    Sepsis (HCC)  * sepsis due to left lower lobe pneumonia.  sent blood cultures , sputum cultures if possible, on vancomycin and Zosyn intravenously.  changed to IV rocephin, Following culture results  * Left lower lobe pneumonia due to unknown infectious agent at present,  continue antibiotics, broad-spectrum, awaited sputum cultures and adjust antibiotics depending on culture results  Appreciated pulmonary help.  * Altered mental status- metabolic encephalopathy due to Infection and alcohol withdrawal.  Pulm suggested intubation, but family decided DNR- so now will call palliative care consult  Check Ammonia level.  * urinary tract infection,   urinary cultures. Continue rocephin antibiotic therapy  * Hypotension,   Due to sepsis- on levophed drip.   * Hypokalemia, likely due to intermittent diarrhea, supplement orally as well as intravenously  Now  hyperkalemia- manage with HD.  * End staging renal disease,   nephrologist involved for further recommendations. Hemodialysis per schedule  On peritoneal dialysis at home. * Hyponatremia, likely due to intravascular volume depletion.follow with IV fluid administration * Metabolic encephalopathy, likely due to infection as well as hepatic encephalopathy. Initiate patient on Xifaxan, there may be some underlying  * Normal EKG, check cardiac enzymes 3, unable to initiate patient on beta blockers due to hypotension 1* c diff Diarrhea - started oral vancomycin.   Family finally agreed on hospice home discharge.  DISCHARGE CONDITIONS:  Fair.  CONSULTS OBTAINED:  Treatment Team:  Shane CrutchPradeep Ramachandran, MD Mosetta PigeonHarmeet Singh, MD Altamese DillingVaibhavkumar Rossana Molchan, MD  DRUG ALLERGIES:  No Known Allergies  DISCHARGE MEDICATIONS:   Current Discharge Medication List    START taking these medications   Details  acetaminophen (TYLENOL) 650 MG suppository Place 1 suppository (650 mg total) rectally every 6 (six) hours as needed for mild pain or fever. Qty: 6 suppository, Refills: 0    glycopyrrolate (ROBINUL) 1 MG tablet Take 1 tablet (1 mg total) by mouth 4 (four) times daily as needed. Qty: 12 tablet, Refills: 0    LORazepam (ATIVAN) 2 MG/ML injection Inject 1 mL (2 mg total) into the vein every 4 (four) hours as needed (severe agitation or seizure activity or severe restlessness). Qty: 10 mL, Refills: 0    !! Morphine Sulfate (MORPHINE CONCENTRATE) 10 MG/0.5ML SOLN concentrated solution Place 0.25 mLs (5 mg total) under the tongue every 6 (six) hours. Qty: 30 mL, Refills: 0    !! Morphine Sulfate (MORPHINE CONCENTRATE) 10 MG/0.5ML SOLN concentrated solution Take 0.25 mLs (5 mg  total) by mouth every hour as needed for severe pain or shortness of breath. Qty: 30 mL, Refills: 0    prochlorperazine (COMPAZINE) 25 MG suppository Place 1 suppository (25 mg total) rectally every 12 (twelve) hours as  needed for nausea or vomiting. Qty: 6 suppository, Refills: 0     !! - Potential duplicate medications found. Please discuss with provider.    CONTINUE these medications which have CHANGED   Details  loperamide (IMODIUM) 2 MG capsule Take 1 capsule (2 mg total) by mouth every 8 (eight) hours as needed for diarrhea or loose stools. Qty: 10 capsule, Refills: 0      STOP taking these medications     furosemide (LASIX) 20 MG tablet      mirtazapine (REMERON) 15 MG tablet      Multiple Vitamin (MULTIVITAMIN WITH MINERALS) TABS tablet      omeprazole (PRILOSEC) 40 MG capsule      HYDROcodone-acetaminophen (NORCO/VICODIN) 5-325 MG tablet          DISCHARGE INSTRUCTIONS:    none  If you experience worsening of your admission symptoms, develop shortness of breath, life threatening emergency, suicidal or homicidal thoughts you must seek medical attention immediately by calling 911 or calling your MD immediately  if symptoms less severe.  You Must read complete instructions/literature along with all the possible adverse reactions/side effects for all the Medicines you take and that have been prescribed to you. Take any new Medicines after you have completely understood and accept all the possible adverse reactions/side effects.   Please note  You were cared for by a hospitalist during your hospital stay. If you have any questions about your discharge medications or the care you received while you were in the hospital after you are discharged, you can call the unit and asked to speak with the hospitalist on call if the hospitalist that took care of you is not available. Once you are discharged, your primary care physician will handle any further medical issues. Please note that NO REFILLS for any discharge medications will be authorized once you are discharged, as it is imperative that you return to your primary care physician (or establish a relationship with a primary care physician if  you do not have one) for your aftercare needs so that they can reassess your need for medications and monitor your lab values.    Today   CHIEF COMPLAINT:   Chief Complaint  Patient presents with  . Altered Mental Status    HISTORY OF PRESENT ILLNESS:  Jhovani Griswold  is a 79 y.o. male with a known history of liver cirrhosis, end-stage renal disease, status post AV fistula formation about a month ago in the left upper extremity, being continued on hemodialysis via right upper chest PermCath. This presents to the hospital with complaints of cough and and weakness as well as confusion. According to patient's family, patient has been declining since at least a few weeks ago. He was noted to have significant weakness as well as confusion earlier today. He has been coughing with dry cough. No phlegm production. He's been intermittently having loose stools and he was noted to have unpleasant urine smell and was brought to emergency room for further evaluation. In emergency room, he was noted to be hypotensive, tachycardic, he was febrile with temperature of 104 rectally, labs revealed hypokalemia with potassium level of 2.4. Elevated total bilirubin as well as elevated ammonia level elevated troponin and lactic acid level of 3.8. Urinalysis revealed pyuria. Patient  chest x-ray showed left lower lobe pneumonia. Hospitalist services were contacted for admission   VITAL SIGNS:  Blood pressure 87/57, pulse 85, temperature 98.8 F (37.1 C), temperature source Axillary, resp. rate 26, height 5\' 8"  (1.727 m), weight 77.7 kg (171 lb 4.8 oz), SpO2 100 %.  I/O:   Intake/Output Summary (Last 24 hours) at 11/29/15 1719 Last data filed at 11/29/15 1400  Gross per 24 hour  Intake 1060.4 ml  Output      0 ml  Net 1060.4 ml    PHYSICAL EXAMINATION:   GENERAL: 79 y.o.-year-old patient lying in the bed with no acute distress. Drowsy. EYES: Pupils equal, round, reactive to light and accommodation. No  scleral icterus.  HEENT: Head atraumatic, normocephalic. Oropharynx and nasopharynx clear.  NECK: Supple, no jugular venous distention. No thyroid enlargement, no tenderness.  LUNGS: Normal breath sounds bilaterally, no wheezing, rales,rhonchi or crepitation. No use of accessory muscles of respiration.  CARDIOVASCULAR: S1, S2 normal. No murmurs, rubs, or gallops.  ABDOMEN: Soft, nontender, mild distended. Bowel sounds present. No organomegaly or mass.  EXTREMITIES: No pedal edema, cyanosis, or clubbing.  NEUROLOGIC: pt is drowsy- opens eyes to stimuli, not following commands. PSYCHIATRIC: The patient is drowsy  SKIN: No obvious rash, lesion, or ulcer.  DATA REVIEW:   CBC  Recent Labs Lab 11/29/15 0314  WBC 4.8  HGB 7.9*  HCT 24.2*  PLT 70*    Chemistries   Recent Labs Lab 11/25/15 1819  11/29/15 0314  NA 132*  < > 136  K 2.4*  < > 4.6  CL 100*  < > 102  CO2 24  < > 29  GLUCOSE 158*  < > 288*  BUN 10  < > 16  CREATININE 2.06*  < > 2.16*  CALCIUM 8.2*  < > 7.7*  MG  --   < > 2.2  AST 251*  --   --   ALT 65*  --   --   ALKPHOS 217*  --   --   BILITOT 4.1*  --   --   < > = values in this interval not displayed.  Cardiac Enzymes  Recent Labs Lab 11/26/15 0914  TROPONINI 0.04*    Microbiology Results  Results for orders placed or performed during the hospital encounter of 11/25/15  Urine culture     Status: None   Collection Time: 11/25/15  6:19 PM  Result Value Ref Range Status   Specimen Description URINE, RANDOM  Final   Special Requests NONE  Final   Culture >=100,000 COLONIES/mL ESCHERICHIA COLI  Final   Report Status 11/28/2015 FINAL  Final   Organism ID, Bacteria ESCHERICHIA COLI  Final      Susceptibility   Escherichia coli - MIC*    AMPICILLIN <=2 SENSITIVE Sensitive     CEFTAZIDIME <=1 SENSITIVE Sensitive     CEFAZOLIN <=4 SENSITIVE Sensitive     CEFTRIAXONE <=1 SENSITIVE Sensitive     CIPROFLOXACIN <=0.25 SENSITIVE Sensitive      GENTAMICIN <=1 SENSITIVE Sensitive     IMIPENEM <=0.25 SENSITIVE Sensitive     TRIMETH/SULFA <=20 SENSITIVE Sensitive     NITROFURANTOIN Value in next row Sensitive      SENSITIVE<=16    PIP/TAZO Value in next row Sensitive      SENSITIVE<=4    * >=100,000 COLONIES/mL ESCHERICHIA COLI  Blood Culture (routine x 2)     Status: None (Preliminary result)   Collection Time: 11/25/15  6:22 PM  Result Value  Ref Range Status   Specimen Description BLOOD RIGHT ARM  Final   Special Requests BOTTLES DRAWN AEROBIC AND ANAEROBIC  1CC  Final   Culture NO GROWTH 3 DAYS  Final   Report Status PENDING  Incomplete  Blood Culture (routine x 2)     Status: None (Preliminary result)   Collection Time: 11/25/15  6:23 PM  Result Value Ref Range Status   Specimen Description BLOOD RIGHT AC  Final   Special Requests BOTTLES DRAWN AEROBIC AND ANAEROBIC  1CC  Final   Culture NO GROWTH 3 DAYS  Final   Report Status PENDING  Incomplete  MRSA PCR Screening     Status: None   Collection Time: 11/25/15  9:10 PM  Result Value Ref Range Status   MRSA by PCR NEGATIVE NEGATIVE Final    Comment:        The GeneXpert MRSA Assay (FDA approved for NASAL specimens only), is one component of a comprehensive MRSA colonization surveillance program. It is not intended to diagnose MRSA infection nor to guide or monitor treatment for MRSA infections.   C difficile quick scan w PCR reflex     Status: Abnormal   Collection Time: 11/27/15  2:13 AM  Result Value Ref Range Status   C Diff antigen POSITIVE (A) NEGATIVE Final   C Diff toxin NEGATIVE NEGATIVE Final   C Diff interpretation   Final    Positive for toxigenic C. difficile, active toxin production not detected. Patient has toxigenic C. difficile organisms present in the bowel, but toxin was not detected. The patient may be a carrier or the level of toxin in the sample was below the limit  of detection. This information should be used in conjunction with the  patient's clinical history when deciding on possible therapy.   Clostridium Difficile by PCR     Status: Abnormal   Collection Time: 11/27/15  2:13 AM  Result Value Ref Range Status   Toxigenic C Difficile by pcr POSITIVE (A) NEGATIVE Final    Comment: CRITICAL RESULT CALLED TO, READ BACK BY AND VERIFIED WITH:  FELICIA PEREUDAMME AT 0960 11/27/15 WDM     RADIOLOGY:  Dg Chest Port 1 View  11/29/2015  CLINICAL DATA:  History of cough, pneumonia, previous tobacco use. ; history of cirrhosis, renal failure, and diabetes. EXAM: PORTABLE CHEST 1 VIEW COMPARISON:  Portable chest x-ray of November 25, 2015 FINDINGS: The lungs remain borderline hypoinflated. The interstitial markings remain increased. Subsegmental atelectasis at the left lung base is more conspicuous today. The cardiac silhouette is mildly enlarged. The central pulmonary vascularity is prominent. The dual-lumen dialysis type catheter tip projects over the midportion of the SVC. The feeding tube tip projects in the proximal gastric body. IMPRESSION: Mildly increased pulmonary vascular congestion consistent with CHF. Interval increase in left lower lobe atelectasis or infiltrate. There is no significant pleural effusion. Electronically Signed   By: David  [place] M.D.   On: 11/29/2015 08:37   Dg Abd Portable 1v  11/28/2015  CLINICAL DATA:  Encounter for NG tube placement. EXAM: PORTABLE ABDOMEN - 1 VIEW COMPARISON:  None. FINDINGS: The heart is mildly enlarged. Bilateral pleural effusions and basilar airspace disease are present. The small bore feeding tube terminates within the antrum of the stomach. IMPRESSION: A small bore feeding tube terminates in the antrum of the stomach. Bilateral Pleural effusions and basilar airspace disease are noted. This likely reflects atelectasis. Electronically Signed   By: Marin Roberts M.D.   On: 11/28/2015 14:05  EKG:   Orders placed or performed during the hospital encounter of 08/09/15  . ED  EKG  . ED EKG  . EKG      Management plans discussed with the patient, family and they are in agreement.  CODE STATUS:     Code Status Orders        Start     Ordered   11/29/15 1455  DNR (Do not attempt resuscitation)   Continuous    Question Answer Comment  In the event of cardiac or respiratory ARREST Do not call a "code blue"   In the event of cardiac or respiratory ARREST Do not perform Intubation, CPR, defibrillation or ACLS   In the event of cardiac or respiratory ARREST Use medication by any route, position, wound care, and other measures to relive pain and suffering. May use oxygen, suction and manual treatment of airway obstruction as needed for comfort.      11/29/15 1455      TOTAL TIME TAKING CARE OF THIS PATIENT: 35 minutes.    Altamese Dilling M.D on 11/29/2015 at 5:19 PM  Between 7am to 6pm - Pager - [phone]  After 6pm go to [email or web address] - password [email or web address] [facility]  [facility]  Office  [phone]  CC: Primary care physician; Clydie Braun, MD   Note: This dictation was prepared with Dragon dictation along with smaller phrase technology. Any transcriptional errors that result from this process are unintentional.

## 2015-11-29 NOTE — Care Management (Signed)
Patient will transfer to the Hospice Home by ems this day

## 2015-11-29 NOTE — Progress Notes (Signed)
New hospice home referral received from [facility] following a Palliative Medicine consult. Mr. Mike Booker is a 79 year old man with a known history of Alcoholic Cirrhosis and end stage renal disease, on dialysis for the past 5 weeks. PMH includes: HTN, DMII, Hepatorenal syndrome and thrombocytopenia. He presented to the [facility] ED on 12/10 with signs of sepsis, thought to be from UTI and a chest xray showed left lower lobe pneumonia. He has been treated with IV antibiotics and now has developed C-Diff.  He has been on CIWA protocol receiving PRN IV lorazepam d/t his alcohol history. He has also required pressor support for continued low blood pressure. Despite these interventions he has failed to improve. Family has met with Dr. Megan Salon and decided to pursue comfort at the hospice home. Patient seen lying in bed, unresponsive to voice, color poor, oxygen ion place at 2 liters via nasal cannula. Last bowel movement today. Staff Rn reports patient is anuric, last dialysis on 12/12. Writer met with patient's wife, [name] and daughter [name] in the patient's room. Education initiated regarding hospice services, philosophy and team approach to care with good understanding voiced. Consents signed and faxed with patient information to referral. Report called to hospice home. [name] and [name] plan to meet the patient at the hospice home, and have requested that staff RN Jerene Pitch call her at [phone] when EMS arrives for pick up. Hospital care team all aware of and in agreement with patient transfer to the hospice home today via EMS. Signed portable DNR in place on patient's chart. Thank you for the opportunity to be involved in the care of this patient. Flo Shanks RN, BSN, [facility] Palliative care of [place], [facility] [phone] c

## 2015-11-29 NOTE — Progress Notes (Signed)
Per Dr.Campbell, patients family request NO dialysis due to making patient comfort care.Dr.Lateef made aware by this Clinical research associatewriter.

## 2015-11-30 DIAGNOSIS — N189 Chronic kidney disease, unspecified: Secondary | ICD-10-CM | POA: Insufficient documentation

## 2015-11-30 LAB — CULTURE, BLOOD (ROUTINE X 2)
CULTURE: NO GROWTH
CULTURE: NO GROWTH

## 2015-11-30 NOTE — Consult Note (Signed)
Palliative Medicine Inpatient Consult Note   Name: Mike Booker Date: 11/30/2015 MRN: [id number]  DOB: Feb 09, 1936  Referring Physician: No att. providers found  Palliative Care consult requested for this 79 y.o. male for goals of medical therapy in patient with End Stage Renal Disease, C Diff, end stage alcoholic cirrhosis, and recent sepsis due to UTI.  TODAY'S DISCUSSIONS AND DECISIONS: Pt seen and examined and I have spoken with family at length. After reviewing all options for aggressive care, comfort care in the home setting with hospice and also the option of Hospice Home (which pt is appropriate for), family has chosen Hospice Home.   I have updated Care Mgr, Dr Belia Heman (we have talked off and on during the course of the day), nursing, and Hospice Liaison.  DNR in the paper chart.   I have informed Dr. Elisabeth Pigeon and will do DC meds.   IMPRESSION ESRD Alcoholic Cirrhosis Alcoholic withdrawal ---now oversedated with build up of Ativan C diff colitis UTI with sepsis from UTI Frailty    REVIEW OF SYSTEMS:  Patient is not able to provide ROS due to unresponsive state  SPIRITUAL SUPPORT SYSTEM: Yes.  SOCIAL HISTORY:  reports that he quit smoking about 49 years ago. His smoking use included Cigarettes. His smokeless tobacco use includes Chew. He reports that he drinks alcohol. He reports that he does not use illicit drugs.  LEGAL DOCUMENTS:  I placed a DNR form in paper chart  CODE STATUS: DNR  PAST MEDICAL HISTORY: Past Medical History  Diagnosis Date  . Alcoholic cirrhosis (HCC)   . Thrombocytopenia (HCC)   . High serum ferritin   . Hepatorenal syndrome (HCC)     Secondary  . IDA (iron deficiency anemia) 05/24/2015  . Hypertension   . Diabetes mellitus without complication (HCC)     no longer diabetic since 3 yr ago    PAST SURGICAL HISTORY:  Past Surgical History  Procedure Laterality Date  . Tonsillectomy    . Fracture surgery  left ankle    still has  screws  . Peripheral vascular catheterization N/A 10/12/2015    Procedure: Dialysis/Perma Catheter Insertion;  Surgeon: Annice Needy, MD;  Location: [facility] INVASIVE CV LAB;  Service: Cardiovascular;  Laterality: N/A;  . Av fistula placement Left 10/19/2015    Procedure: INSERTION OF ARTERIOVENOUS (AV) GORE-TEX GRAFT ARM;  Surgeon: Annice Needy, MD;  Location: [facility] ORS;  Service: Vascular;  Laterality: Left;    ALLERGIES:  has No Known Allergies.  MEDICATIONS:  No current facility-administered medications for this encounter.   Current Outpatient Prescriptions  Medication Sig Dispense Refill  . acetaminophen (TYLENOL) 650 MG suppository Place 1 suppository (650 mg total) rectally every 6 (six) hours as needed for mild pain or fever. 6 suppository 0  . glycopyrrolate (ROBINUL) 1 MG tablet Take 1 tablet (1 mg total) by mouth 4 (four) times daily as needed. 12 tablet 0  . loperamide (IMODIUM) 2 MG capsule Take 1 capsule (2 mg total) by mouth every 8 (eight) hours as needed for diarrhea or loose stools. 10 capsule 0  . LORazepam (ATIVAN) 2 MG/ML injection Inject 1 mL (2 mg total) into the vein every 4 (four) hours as needed (severe agitation or seizure activity or severe restlessness). 10 mL 0  . Morphine Sulfate (MORPHINE CONCENTRATE) 10 MG/0.5ML SOLN concentrated solution Place 0.25 mLs (5 mg total) under the tongue every 6 (six) hours. 30 mL 0  . Morphine Sulfate (MORPHINE CONCENTRATE) 10 MG/0.5ML SOLN concentrated solution Take 0.25  mLs (5 mg total) by mouth every hour as needed for severe pain or shortness of breath. 30 mL 0  . prochlorperazine (COMPAZINE) 25 MG suppository Place 1 suppository (25 mg total) rectally every 12 (twelve) hours as needed for nausea or vomiting. 6 suppository 0    Vital Signs: BP 81/50 mmHg  Pulse 53  Temp(Src) 98.8 F (37.1 C) (Axillary)  Resp 23  Ht 5\' 8"  (1.727 m)  Wt 77.7 kg (171 lb 4.8 oz)  BMI 26.05 kg/m2  SpO2 100% Filed Weights   11/25/15 1816 11/27/15  1025  Weight: 74.2 kg (163 lb 9.3 oz) 77.7 kg (171 lb 4.8 oz)    Estimated body mass index is 26.05 kg/(m^2) as calculated from the following:   Height as of this encounter: 5\' 8"  (1.727 m).   Weight as of this encounter: 77.7 kg (171 lb 4.8 oz).  PERFORMANCE STATUS (ECOG) : 4 - Bedbound  PHYSICAL EXAM: Unresponsive in ICU bed --with mouth open NAD ? Restless and moaning at times, however Eyes closed Neck w JVD and no TM Hrt rrr w irreg beats Lungs with rales Abd soft nt Ext no cyanosis or mottling  LABS: CBC:    Component Value Date/Time   WBC 4.8 11/29/2015 0314   WBC 3.9 01/17/2014 1431   HGB 7.9* 11/29/2015 0314   HGB 9.0* 01/17/2014 1431   HCT 24.2* 11/29/2015 0314   HCT 26.8* 01/17/2014 1431   PLT 70* 11/29/2015 0314   PLT 101* 01/17/2014 1431   MCV 109.1* 11/29/2015 0314   MCV 104* 01/17/2014 1431   NEUTROABS 3.3 11/25/2015 1819   NEUTROABS 2.4 01/17/2014 1431   LYMPHSABS 0.4* 11/25/2015 1819   LYMPHSABS 1.1 01/17/2014 1431   MONOABS 0.2 11/25/2015 1819   MONOABS 0.4 01/17/2014 1431   EOSABS 0.0 11/25/2015 1819   EOSABS 0.0 01/17/2014 1431   BASOSABS 0.0 11/25/2015 1819   BASOSABS 0.0 01/17/2014 1431   Comprehensive Metabolic Panel:    Component Value Date/Time   NA 136 11/29/2015 0314   NA 139 12/21/2013 1412   K 4.6 11/29/2015 0314   K 3.9 12/21/2013 1412   CL 102 11/29/2015 0314   CL 100 12/21/2013 1412   CO2 29 11/29/2015 0314   CO2 26 12/21/2013 1412   BUN 16 11/29/2015 0314   BUN 62* 12/21/2013 1412   CREATININE 2.16* 11/29/2015 0314   CREATININE 4.59* 12/21/2013 1412   GLUCOSE 288* 11/29/2015 0314   GLUCOSE 131* 12/21/2013 1412   CALCIUM 7.7* 11/29/2015 0314   CALCIUM 9.6 12/21/2013 1412   AST 251* 11/25/2015 1819   AST 22 06/08/2013 0830   ALT 65* 11/25/2015 1819   ALT 15 06/08/2013 0830   ALKPHOS 217* 11/25/2015 1819   ALKPHOS 76 06/08/2013 0830   BILITOT 4.1* 11/25/2015 1819   BILITOT 0.9 06/08/2013 0830   PROT 5.5* 11/25/2015  1819   PROT 5.2* 06/08/2013 0830   ALBUMIN 1.5* 11/28/2015 1556   ALBUMIN 1.9* 06/08/2013 0830    More than 50% of the visit was spent in counseling/coordination of care: Yes  Time Spent: 80minutes

## 2015-12-17 DEATH — deceased

## 2016-01-28 ENCOUNTER — Encounter: Payer: Self-pay | Admitting: Hematology and Oncology

## 2016-02-09 ENCOUNTER — Other Ambulatory Visit: Payer: Self-pay

## 2016-02-09 ENCOUNTER — Ambulatory Visit: Payer: Self-pay | Admitting: Hematology and Oncology

## 2016-02-11 IMAGING — CT CT MAXILLOFACIAL W/O CM
5 of 10 series · 16 of 47 positions shown, 18 images · non-contrast
Comparison: None

CLINICAL DATA: Fall, RIGHT parietal and maxillary contusions, 3
falls in last 2 years, former smoker, alcoholic cirrhosis,
hepatorenal syndrome, type II diabetes mellitus

EXAM:
CT HEAD WITHOUT CONTRAST
CT MAXILLOFACIAL WITHOUT CONTRAST
TECHNIQUE: Multidetector CT imaging of the head and maxillofacial structures
were performed using the standard protocol without intravenous
contrast. Multiplanar CT image reconstructions of the maxillofacial
structures were also generated. Right side of face marked with BB.

[Series 8: max soft 2 · axial · 0.38mm/px · z∈[+166,+197]mm · 2 of 96 slices shown]
[im 16/96  brain]
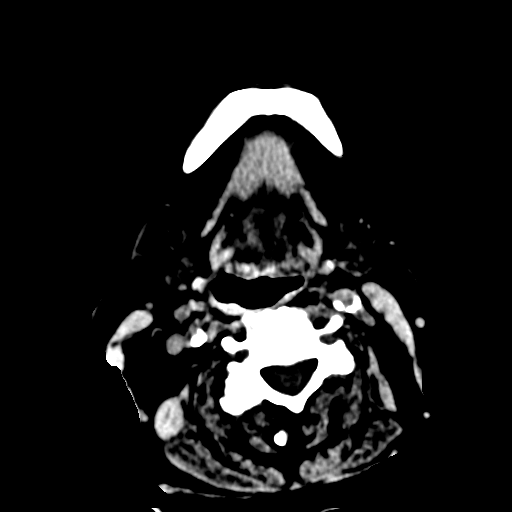
[im 32/96  brain]
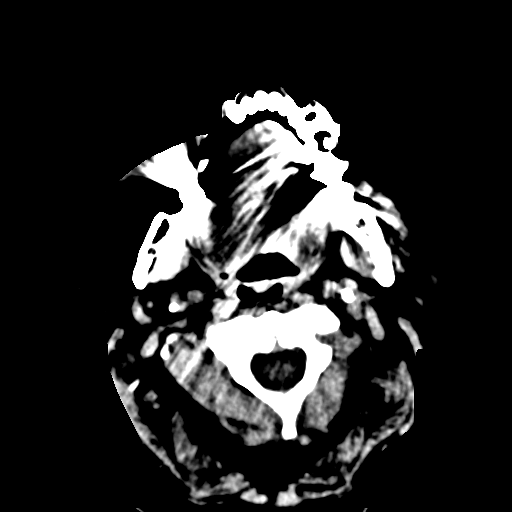

[Series 9: max bone 2 · axial · 0.38mm/px · z∈[+160,+285]mm · 5 of 96 slices shown, 7 images]
[im 16/96  brain]
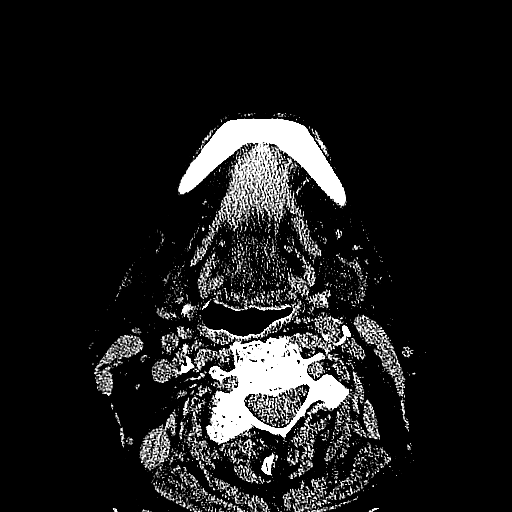
[im 16/96  bone]
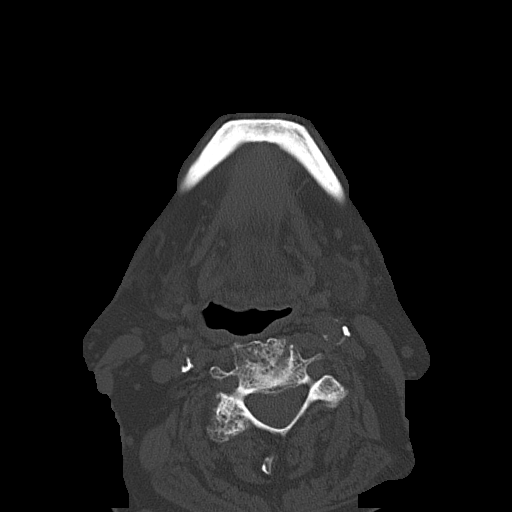
[im 32/96  bone]
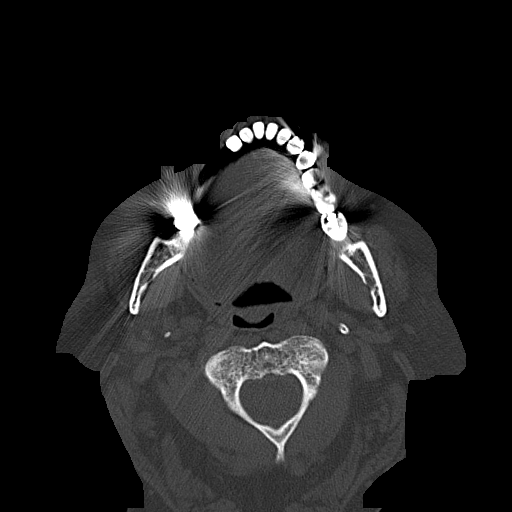
[im 48/96  bone]
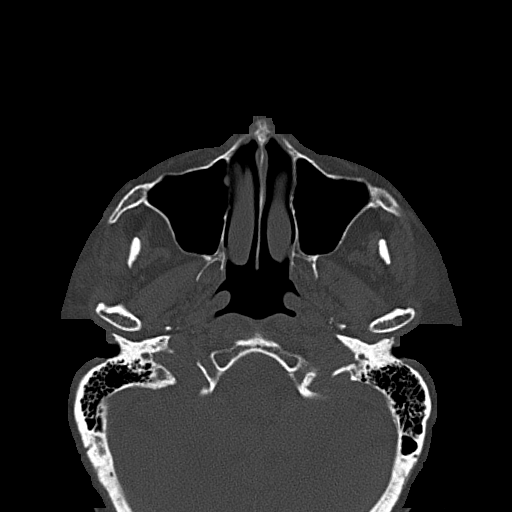
[im 64/96  bone]
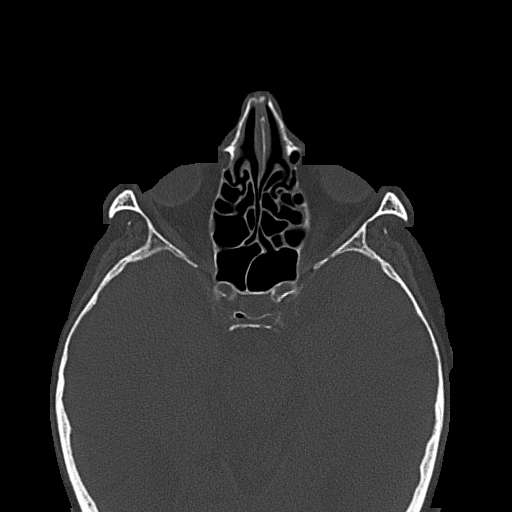
[im 80/96  brain]
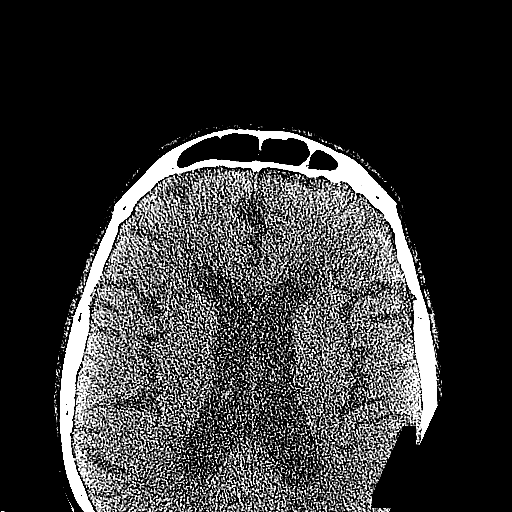
[im 80/96  bone]
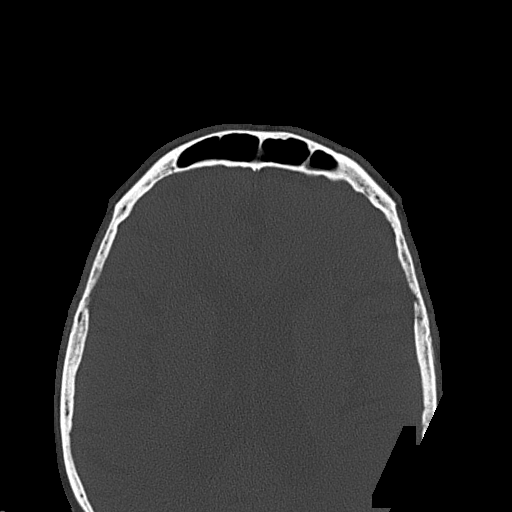

[Series 10: coronal soft · coronal · 0.34mm/px · 3 of 83 slices shown]
[im 28/83  bone]
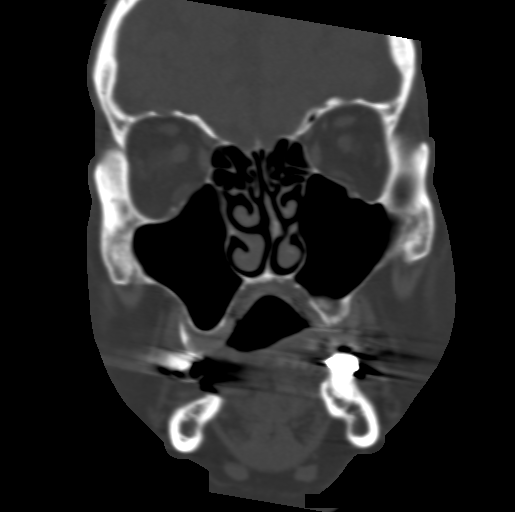
[im 37/83  bone]
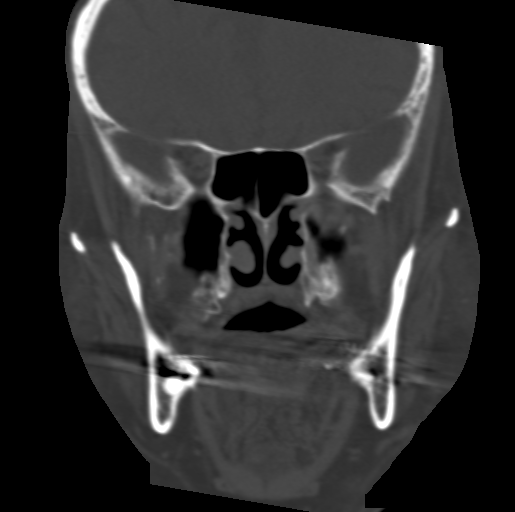
[im 46/83  bone]
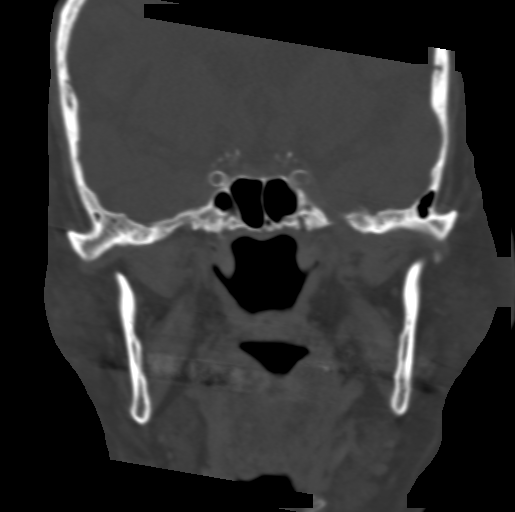

[Series 12: head bone 2 · axial · 0.36mm/px · z∈[+321,+382]mm · 3 of 70 slices shown]
[im 18/70  bone]
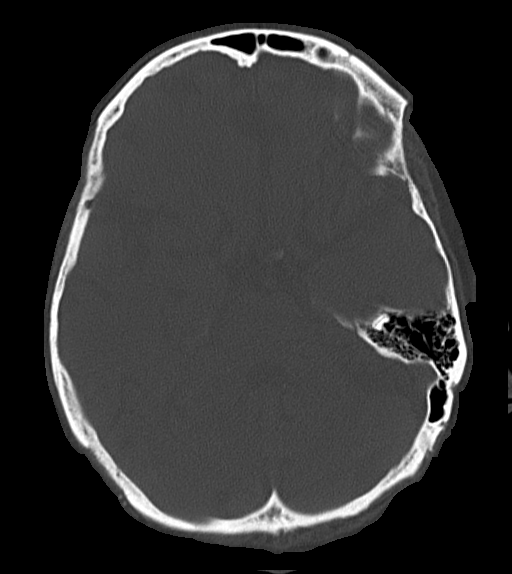
[im 35/70  bone]
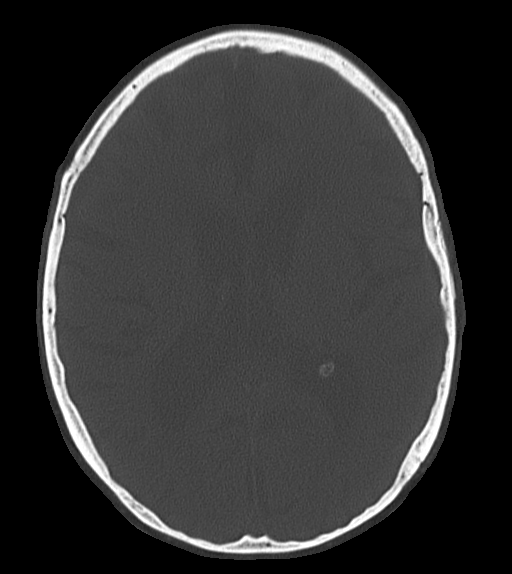
[im 52/70  bone]
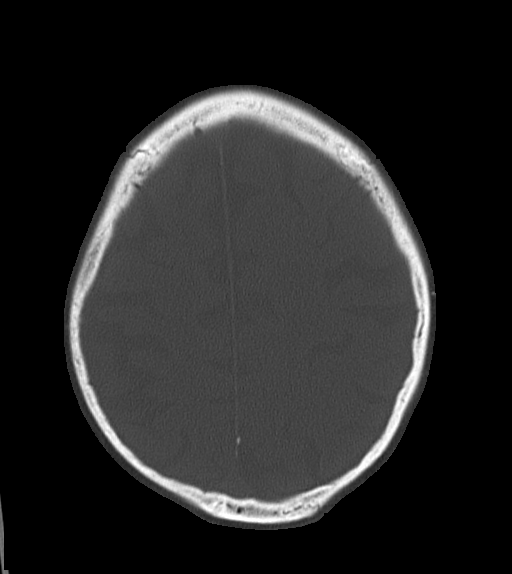

[Series 15: sagittal bone · sagittal · 0.34mm/px · 3 of 88 slices shown]
[im 22/88  bone]
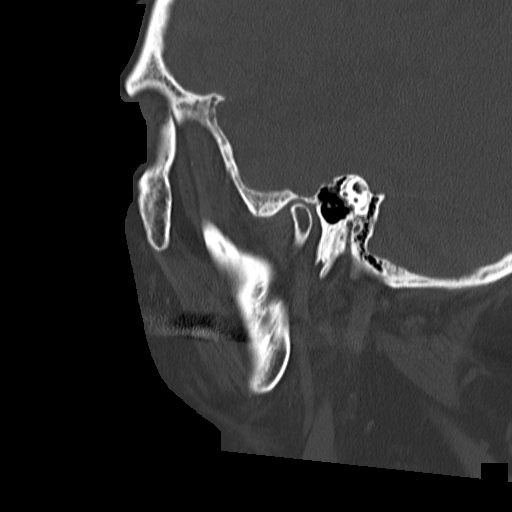
[im 47/88  bone]
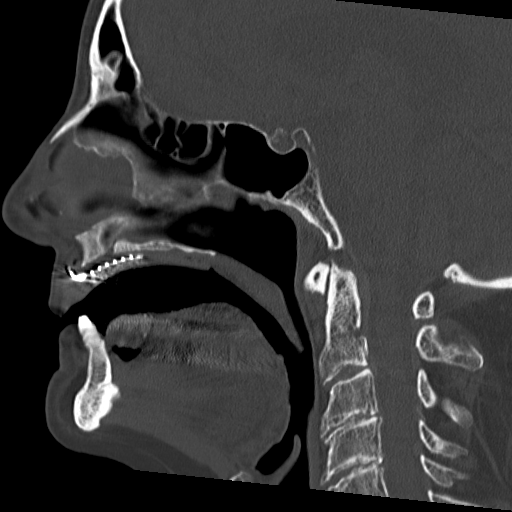
[im 73/88  bone]
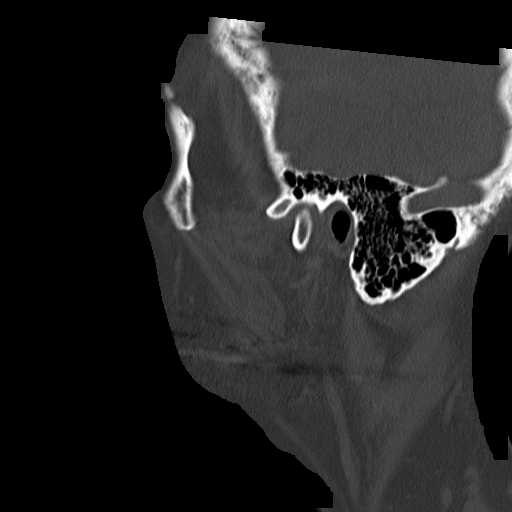

[16 of 47 positions shown; findings below may reference images not displayed]

FINDINGS: CT HEAD FINDINGS

Asymmetric positioning in gantry.

Generalized atrophy.

Normal ventricular morphology.

No midline shift or mass effect.

Small vessel chronic ischemic changes of deep cerebral white matter.

No intracranial hemorrhage, mass lesion, or evidence acute
infarction.

No extra-axial fluid collections.

Bones demineralized.

No fractures seen.

Atherosclerotic calcification of internal carotid and vertebral
arteries at skullbase.

CT MAXILLOFACIAL FINDINGS

Artifacts from denture plates.

Bones demineralized.

Paranasal sinuses, mastoid air cells, and middle ear cavities clear
bilaterally.

Mild nasal septal deviation to the LEFT.

Aeration of the middle turbinates bilaterally.

No facial bone fractures identified.

Multilevel degenerative disc and facet disease changes of the
visualized upper cervical spine.
IMPRESSION: Atrophy with small vessel chronic ischemic changes of deep cerebral
white matter.

No acute intracranial abnormalities.

No acute cervical spine abnormalities.

Degenerative disc and facet disease changes of the visualized upper
cervical spine.
